# Patient Record
Sex: Male | Born: 1980 | Race: Black or African American | Hispanic: No | Marital: Married | Smoking: Former smoker
Health system: Southern US, Community
[De-identification: ages and names within clinical notes are randomized; demographics above are authoritative.]

## PROBLEM LIST (undated history)

## (undated) DIAGNOSIS — M549 Dorsalgia, unspecified: Secondary | ICD-10-CM

## (undated) DIAGNOSIS — C6290 Malignant neoplasm of unspecified testis, unspecified whether descended or undescended: Secondary | ICD-10-CM

## (undated) DIAGNOSIS — T7840XA Allergy, unspecified, initial encounter: Secondary | ICD-10-CM

## (undated) HISTORY — DX: Allergy, unspecified, initial encounter: T78.40XA

## (undated) HISTORY — PX: OTHER SURGICAL HISTORY: SHX169

## (undated) HISTORY — DX: Malignant neoplasm of unspecified testis, unspecified whether descended or undescended: C62.90

---
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
## 2002-07-12 ENCOUNTER — Emergency Department (HOSPITAL_COMMUNITY): Admission: EM | Admit: 2002-07-12 | Discharge: 2002-07-12 | Payer: Self-pay | Admitting: Emergency Medicine

## 2005-03-25 ENCOUNTER — Emergency Department (HOSPITAL_COMMUNITY): Admission: EM | Admit: 2005-03-25 | Discharge: 2005-03-25 | Payer: Self-pay | Admitting: Emergency Medicine

## 2005-11-16 ENCOUNTER — Ambulatory Visit: Payer: Self-pay | Admitting: Psychiatry

## 2005-11-16 ENCOUNTER — Inpatient Hospital Stay (HOSPITAL_COMMUNITY): Admission: RE | Admit: 2005-11-16 | Discharge: 2005-11-20 | Payer: Self-pay | Admitting: Psychiatry

## 2005-12-08 ENCOUNTER — Emergency Department (HOSPITAL_COMMUNITY): Admission: EM | Admit: 2005-12-08 | Discharge: 2005-12-08 | Payer: Self-pay | Admitting: Emergency Medicine

## 2006-07-09 ENCOUNTER — Emergency Department (HOSPITAL_COMMUNITY): Admission: EM | Admit: 2006-07-09 | Discharge: 2006-07-09 | Payer: Self-pay | Admitting: Emergency Medicine

## 2008-08-04 ENCOUNTER — Emergency Department (HOSPITAL_COMMUNITY): Admission: EM | Admit: 2008-08-04 | Discharge: 2008-08-04 | Payer: Self-pay | Admitting: Emergency Medicine

## 2009-10-01 ENCOUNTER — Emergency Department (HOSPITAL_COMMUNITY): Admission: EM | Admit: 2009-10-01 | Discharge: 2009-10-01 | Payer: Self-pay | Admitting: Family Medicine

## 2009-11-20 ENCOUNTER — Emergency Department (HOSPITAL_COMMUNITY): Admission: EM | Admit: 2009-11-20 | Discharge: 2009-11-20 | Payer: Self-pay | Admitting: Emergency Medicine

## 2010-02-03 ENCOUNTER — Emergency Department (HOSPITAL_COMMUNITY): Admission: EM | Admit: 2010-02-03 | Discharge: 2010-02-03 | Payer: Self-pay | Admitting: Emergency Medicine

## 2010-06-19 ENCOUNTER — Emergency Department (HOSPITAL_COMMUNITY): Admission: EM | Admit: 2010-06-19 | Discharge: 2010-06-20 | Payer: Self-pay | Admitting: Emergency Medicine

## 2010-10-14 ENCOUNTER — Emergency Department (HOSPITAL_COMMUNITY): Admission: EM | Admit: 2010-10-14 | Discharge: 2010-10-14 | Payer: Self-pay | Admitting: Emergency Medicine

## 2011-05-15 NOTE — Discharge Summary (Signed)
NAMEMALEKI, HIPPE               ACCOUNT NO.:  [id number]   MEDICAL RECORD NO.:  [id number]          PATIENT TYPE:  IPS   LOCATION:  0303                          FACILITY:  [facility]   PHYSICIAN:  Geoffery Lyons, M.D.      DATE OF BIRTH:  22-Aug-1981   DATE OF ADMISSION:  11/16/2005  DATE OF DISCHARGE:  11/20/2005                                 DISCHARGE SUMMARY   CHIEF COMPLAINT AND PRESENT ILLNESS:  This was the first admission to [facility] for this 30 year old single African-American male  voluntarily admitted.  He apparently experienced a change in behavior,  becoming depressed.  There were auditory hallucinations, God's voice,  feeling guilty over things he has said, believed in three worlds, recently  seen God and the devil.  Believed that brother could read his mind.  He  thinks he is Jesus.  Sleeping poorly.  Reports recently pressured to do  marijuana.  Used drugs on Friday.  The patient believes he may have AIDS.   PAST PSYCHIATRIC HISTORY:  First time at [facility].  No previous  treatment.   ALCOHOL/DRUG HISTORY:  Recently smoked marijuana.  Not clear of pattern of  use.  There is also some benzodiazepine use.   MEDICAL HISTORY:  Healthy.   MEDICATIONS:  None.   PHYSICAL EXAMINATION:  Performed and failed to show any acute findings.   LABORATORY DATA:  Liver enzymes with SGOT 19, SGPT 14, total bilirubin 2.3,  TSH 1.548.  CBC with white blood cells 6.6, hemoglobin 15.3.  Drug screening  positive for benzodiazepines and marijuana.   MENTAL STATUS EXAM:  Alert, soft-spoken male.  Good eye contact.  Speech  soft, normal rate.  Mood feeling guilty.  Affect smiling inappropriately.  Thought processes positive for auditory hallucinations with visual  hallucinations, delusions.  Cognition was well-preserved.   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  Marijuana abuse.  AXIS II:  No diagnosis.  AXIS III:  No diagnosis.  AXIS IV:  Moderate.  AXIS V:  GAF upon admission 25; highest GAF in the last year 70.   HOSPITAL COURSE:  He was admitted.  He was started in individual and group  psychotherapy.  He was given Zyprexa Zydis and he was also given some  Librium.  We continued to work with the Zyprexa that he tolerated well.  Upon admission, he stated that he wanted to die for everyone's sin but he  was not going to commit suicide.  He endorsed that people would think that  he was crazy but he said he was not crazy, he was just loving everybody.  Affect was very labile, laughing and being tearful at the same time.  Seemed  to be responding to internal stimuli.  Endorsed that he heard voices all the  time.  There was evidence of the hyperreligiosity, ideas of reference,  auditory hallucinations.  There was a family session.  The mother stated  that she believed that the psychotic break  came because of his marijuana  use.  One month prior to this admission, he apparently got in trouble with  the police while drunk.  He, himself, said that he was less confused.  For  the next couple of days, he continued to improve.  He was still religiously  preoccupied.  Wanting to be __________ about the church but, the way he was  producing the material, he was more reality based as his mother was a  Programmer, multimedia.  He started playing piano.  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  On  November 19, 2005, he was endorsing he was doing really well.  Endorsed  suicidal or homicidal ideation.  No hallucinations.  He overall seemed  better, more in touch with reality.  No evidence of the hyperreligiosity,  the delusional ideas that he had evidenced before.  He said he was committed  to abstinence.  So overall, he seemed to be better for what he was  discharged to outpatient follow-up.   DISCHARGE DIAGNOSES:  AXIS I:  Rule out  bipolar disorder with psychosis.  Rule out substance-induced psychosis.  Marijuana abuse.  AXIS II:  No diagnosis.  AXIS III:  No diagnosis.  AXIS IV:  Moderate.  AXIS V:  GAF upon discharge 50.   DISCHARGE MEDICATIONS:  Zyprexa Zydis 20 mg at night.   FOLLOW UP:  Dr. Lang Snow at [facility].      Geoffery Lyons, M.D.  Electronically Signed     IL/MEDQ  D:  11/25/2005  T:  11/26/2005  Job:  [id number]

## 2011-07-23 ENCOUNTER — Emergency Department (HOSPITAL_COMMUNITY)
Admission: EM | Admit: 2011-07-23 | Discharge: 2011-07-24 | Disposition: A | Discharge status: Home / Self Care | Payer: BC Managed Care – PPO | Attending: Emergency Medicine | Admitting: Emergency Medicine

## 2011-07-23 DIAGNOSIS — X500XXA Overexertion from strenuous movement or load, initial encounter: Secondary | ICD-10-CM | POA: Insufficient documentation

## 2011-07-23 DIAGNOSIS — S239XXA Sprain of unspecified parts of thorax, initial encounter: Secondary | ICD-10-CM | POA: Insufficient documentation

## 2011-07-23 DIAGNOSIS — M546 Pain in thoracic spine: Secondary | ICD-10-CM | POA: Insufficient documentation

## 2011-07-23 DIAGNOSIS — G8929 Other chronic pain: Secondary | ICD-10-CM | POA: Insufficient documentation

## 2011-09-18 ENCOUNTER — Other Ambulatory Visit: Payer: Self-pay | Admitting: Urology

## 2011-09-24 ENCOUNTER — Emergency Department (HOSPITAL_COMMUNITY)
Admission: EM | Admit: 2011-09-24 | Discharge: 2011-09-24 | Disposition: A | Discharge status: Home / Self Care | Payer: BC Managed Care – PPO | Attending: Emergency Medicine | Admitting: Emergency Medicine

## 2011-09-24 ENCOUNTER — Emergency Department (HOSPITAL_COMMUNITY): Payer: BC Managed Care – PPO

## 2011-09-24 DIAGNOSIS — N508 Other specified disorders of male genital organs: Secondary | ICD-10-CM | POA: Insufficient documentation

## 2011-09-24 DIAGNOSIS — G8929 Other chronic pain: Secondary | ICD-10-CM | POA: Insufficient documentation

## 2011-09-24 DIAGNOSIS — N509 Disorder of male genital organs, unspecified: Secondary | ICD-10-CM | POA: Insufficient documentation

## 2011-09-24 DIAGNOSIS — M549 Dorsalgia, unspecified: Secondary | ICD-10-CM | POA: Insufficient documentation

## 2011-09-24 LAB — DIFFERENTIAL
Basophils Relative: 1 % (ref 0–1)
Eosinophils Absolute: 0.1 10*3/uL (ref 0.0–0.7)
Lymphs Abs: 1.5 10*3/uL (ref 0.7–4.0)
Monocytes Relative: 8 % (ref 3–12)
Neutro Abs: 3.5 10*3/uL (ref 1.7–7.7)
Neutrophils Relative %: 63 % (ref 43–77)

## 2011-09-24 LAB — CBC
Hemoglobin: 15.6 g/dL (ref 13.0–17.0)
MCH: 30.2 pg (ref 26.0–34.0)
Platelets: 217 10*3/uL (ref 150–400)
RBC: 5.16 MIL/uL (ref 4.22–5.81)
WBC: 5.5 10*3/uL (ref 4.0–10.5)

## 2011-09-24 LAB — BASIC METABOLIC PANEL
CO2: 32 mEq/L (ref 19–32)
Chloride: 104 mEq/L (ref 96–112)
Glucose, Bld: 86 mg/dL (ref 70–99)
Potassium: 3.9 mEq/L (ref 3.5–5.1)
Sodium: 140 mEq/L (ref 135–145)

## 2011-09-24 LAB — URINALYSIS, ROUTINE W REFLEX MICROSCOPIC
Glucose, UA: NEGATIVE mg/dL
Hgb urine dipstick: NEGATIVE
Leukocytes, UA: NEGATIVE
Protein, ur: NEGATIVE mg/dL
Specific Gravity, Urine: 1.018 (ref 1.005–1.030)
pH: 8 (ref 5.0–8.0)

## 2011-09-26 ENCOUNTER — Ambulatory Visit (HOSPITAL_COMMUNITY)
Admission: RE | Admit: 2011-09-26 | Discharge: 2011-09-26 | Disposition: A | Discharge status: Home / Self Care | Payer: BC Managed Care – PPO | Source: Ambulatory Visit | Attending: Urology | Admitting: Urology

## 2011-09-26 DIAGNOSIS — C6290 Malignant neoplasm of unspecified testis, unspecified whether descended or undescended: Secondary | ICD-10-CM | POA: Insufficient documentation

## 2011-09-26 LAB — SURGICAL PCR SCREEN: Staphylococcus aureus: NEGATIVE

## 2011-09-26 LAB — CBC
HCT: 46.5 % (ref 39.0–52.0)
Hemoglobin: 15.9 g/dL (ref 13.0–17.0)
MCV: 91.5 fL (ref 78.0–100.0)
RBC: 5.08 MIL/uL (ref 4.22–5.81)
RDW: 13.5 % (ref 11.5–15.5)
WBC: 4.5 10*3/uL (ref 4.0–10.5)

## 2011-10-02 NOTE — Op Note (Signed)
William Farmer, William Farmer               ACCOUNT NO.:  [id number]  MEDICAL RECORD NO.:  [id number]  LOCATION:  DAYL                         FACILITY:  [facility]  PHYSICIAN:  Nial Hawe I. Patsi Sears, M.D.DATE OF BIRTH:  1981-09-29  DATE OF PROCEDURE: DATE OF DISCHARGE:                              OPERATIVE REPORT   PREOPERATIVE DIAGNOSIS:  Right solid testicular mass.  POSTOPERATIVE DIAGNOSIS:  Cancer of the right testicle.  OPERATIONS:  Right radical orchiectomy with frozen section and evaluation.  SURGEON:  Hargis Vandyne I. Patsi Sears, M.D.  ANESTHESIA:  General LMA.  PREPARATION:  After appropriate preanesthesia, the patient was brought to the operating room, placed upon the operating room in dorsal supine position.  The right inguinal area was prepped and draped.  The right arm had previously been marked for side identification.  REVIEW OF HISTORY:  This 30 year old recently married male at 12-day history of a right testicular pain, was seen at [facility] emergency room.  Ultrasound of the testicle showed a bilateral microlithiasis, and a 1.7 x 1.3 x 1.6 cm solid and cystic right intratesticular mass, which was highly suspicious for testicle cancer.  The patient was seen in the office yesterday, and had blood drawn for hCG and alpha fetoprotein. Examination indeed shows a firm right testicular mass.  The patient was counseled with regard to right orchiectomy.  He will have frozen section to be sure that other diagnosis because of his young age, and recent marriage.  PROCEDURE:  A 5 cm right inguinal incision was made.  Subcutaneous tissue dissected yielding the external oblique fascia.  This was incised sharply, and then blunt scissors were used to dissect underneath in order to avoid injury to the ilioinguinal nerve.  The fascia was opened down to the external ring, and a right-angle clamp was used underneath the cord to dissect the cord at the level of the pubic tubercle.   The testicle was then delivered by blunt dissection into the wound, and the Penrose drain was doubly wrapped around the cord, and clamped.  The patient had the crossclamp time evaluated, and during that time, had opened biopsy of the testicle, which showed necrosis.  Viable testicle was then sent in for frozen section, and frozen section analysis showed definite cancer, with necrosis.  Complete diagnosis is pending, but frozen section favors yolk sac tumor.  Therefore, the patient underwent radical orchiectomy, with the spermatic cord then dissected into two portions, and each doubly clamped.  The testicle was amputated, and each clamp was then ligated with 0 PDS suture.  A 0 PDS suture ligature was placed with a long suture attachment.  Irrigation was accomplished and the suture and cord were then replaced in the wound.  The external oblique fascia was closed with 3-0 PDS suture in running fashion.  The subcutaneous tissue was closed with 3-0 Monocryl, and the skin was closed with skin stapler.  20 cc of Marcaine 0.25 plain was injected in the spermatic cord to afford a cord block, and also into the skin edges of the wound. The patient tolerated the procedure well.  He was given IV Tylenol at the beginning of the procedure, will be given IV Toradol at the end  of the procedure.  He was awakened, taken to recovery room in good condition.     Silena Wyss I. Patsi Sears, M.D.     SIT/MEDQ  D:  09/26/2011  T:  09/26/2011  Job:  [id number]  Electronically Signed by Jethro Bolus M.D. on 10/02/2011 12:53:19 PM

## 2011-10-05 ENCOUNTER — Encounter: Payer: BC Managed Care – PPO | Admitting: Oncology

## 2011-10-14 ENCOUNTER — Encounter (HOSPITAL_BASED_OUTPATIENT_CLINIC_OR_DEPARTMENT_OTHER): Payer: BC Managed Care – PPO | Admitting: Oncology

## 2011-10-14 ENCOUNTER — Other Ambulatory Visit: Payer: Self-pay | Admitting: Oncology

## 2011-10-14 ENCOUNTER — Ambulatory Visit (HOSPITAL_COMMUNITY)
Admission: RE | Admit: 2011-10-14 | Discharge: 2011-10-14 | Disposition: A | Discharge status: Home / Self Care | Payer: BC Managed Care – PPO | Source: Ambulatory Visit | Attending: Oncology | Admitting: Oncology

## 2011-10-14 DIAGNOSIS — R51 Headache: Secondary | ICD-10-CM

## 2011-10-14 DIAGNOSIS — C6290 Malignant neoplasm of unspecified testis, unspecified whether descended or undescended: Secondary | ICD-10-CM

## 2011-10-14 LAB — CBC WITH DIFFERENTIAL/PLATELET
EOS%: 1.6 % (ref 0.0–7.0)
Eosinophils Absolute: 0 10*3/uL (ref 0.0–0.5)
HCT: 46.2 % (ref 38.4–49.9)
LYMPH%: 45.6 % (ref 14.0–49.0)
MCHC: 34.6 g/dL (ref 32.0–36.0)
MONO#: 0.2 10*3/uL (ref 0.1–0.9)
MONO%: 7.5 % (ref 0.0–14.0)
NEUT#: 1.1 10*3/uL — ABNORMAL LOW (ref 1.5–6.5)
Platelets: 231 10*3/uL (ref 140–400)
WBC: 2.5 10*3/uL — ABNORMAL LOW (ref 4.0–10.3)
lymph#: 1.2 10*3/uL (ref 0.9–3.3)

## 2011-10-17 LAB — AFP TUMOR MARKER: AFP-Tumor Marker: 8.3 ng/mL — ABNORMAL HIGH (ref 0.0–8.0)

## 2011-10-17 LAB — COMPREHENSIVE METABOLIC PANEL
Alkaline Phosphatase: 63 U/L (ref 39–117)
Creatinine, Ser: 1.33 mg/dL (ref 0.50–1.35)
Glucose, Bld: 86 mg/dL (ref 70–99)
Sodium: 137 mEq/L (ref 135–145)
Total Bilirubin: 1.8 mg/dL — ABNORMAL HIGH (ref 0.3–1.2)
Total Protein: 7.6 g/dL (ref 6.0–8.3)

## 2011-10-17 LAB — BETA HCG QUANT (REF LAB): Beta hCG, Tumor Marker: <0.5 m[IU]/mL (ref <5.0)

## 2011-12-26 ENCOUNTER — Telehealth: Payer: Self-pay | Admitting: Oncology

## 2011-12-26 NOTE — Telephone Encounter (Signed)
pt aware of appts for 2/19 and 2/22,per mosaiq    aom

## 2012-01-25 ENCOUNTER — Encounter: Payer: Self-pay | Admitting: *Deleted

## 2012-02-16 ENCOUNTER — Other Ambulatory Visit (HOSPITAL_BASED_OUTPATIENT_CLINIC_OR_DEPARTMENT_OTHER): Payer: BC Managed Care – PPO | Admitting: Lab

## 2012-02-16 DIAGNOSIS — C6290 Malignant neoplasm of unspecified testis, unspecified whether descended or undescended: Secondary | ICD-10-CM

## 2012-02-16 LAB — COMPREHENSIVE METABOLIC PANEL
ALT: 19 U/L (ref 0–53)
Albumin: 4.4 g/dL (ref 3.5–5.2)
CO2: 29 mEq/L (ref 19–32)
Glucose, Bld: 82 mg/dL (ref 70–99)
Potassium: 3.9 mEq/L (ref 3.5–5.3)
Sodium: 140 mEq/L (ref 135–145)
Total Protein: 7.5 g/dL (ref 6.0–8.3)

## 2012-02-16 LAB — CBC WITH DIFFERENTIAL/PLATELET
Basophils Absolute: 0 10*3/uL (ref 0.0–0.1)
Eosinophils Absolute: 0.1 10*3/uL (ref 0.0–0.5)
HGB: 14.5 g/dL (ref 13.0–17.1)
MCV: 88.1 fL (ref 79.3–98.0)
MONO#: 0.3 10*3/uL (ref 0.1–0.9)
NEUT#: 1.3 10*3/uL — ABNORMAL LOW (ref 1.5–6.5)
RBC: 4.77 10*6/uL (ref 4.20–5.82)
RDW: 13.3 % (ref 11.0–14.6)
WBC: 2.6 10*3/uL — ABNORMAL LOW (ref 4.0–10.3)
lymph#: 1.1 10*3/uL (ref 0.9–3.3)
nRBC: 0 % (ref 0–0)

## 2012-02-16 LAB — LACTATE DEHYDROGENASE: LDH: 198 U/L (ref 94–250)

## 2012-02-19 ENCOUNTER — Ambulatory Visit (HOSPITAL_BASED_OUTPATIENT_CLINIC_OR_DEPARTMENT_OTHER): Payer: BC Managed Care – PPO | Admitting: Oncology

## 2012-02-19 VITALS — BP 126/85 | HR 59 | Temp 97.0°F | Ht 70.5 in | Wt 147.7 lb

## 2012-02-19 DIAGNOSIS — C6290 Malignant neoplasm of unspecified testis, unspecified whether descended or undescended: Secondary | ICD-10-CM

## 2012-02-19 LAB — AFP TUMOR MARKER: AFP-Tumor Marker: 4.3 ng/mL (ref 0.0–8.0)

## 2012-02-19 NOTE — Progress Notes (Signed)
Hematology and Oncology Follow Up Visit  MUAD NOGA [id number] 23-Apr-1981 31 y.o. 02/19/2012 11:43 AM    Principle Diagnosis: 31 year old gentleman with recent diagnosis of testicular cancer.  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   Interim History:  Mr. Willette presents today for a follow up visit. he is asymptomatic at this point.  He is reporting some tenderness around the right groin area.  He does report some occasional headaches really not associated with any neurological symptoms.  He is not waking him up at night.  It is not associated with any confusion or disorientation. No new complaints at this time.   Medications: I have reviewed the patient's current medications. Current outpatient prescriptions:acetaminophen (TYLENOL) 650 MG CR tablet, Take 650 mg by mouth every 8 (eight) hours as needed., Disp: , Rfl:   Allergies:  Allergies  Allergen Reactions  . Alprazolam   . Codeine   . Iodine     Past Medical History, Surgical history, Social history, and Family History were reviewed and updated.  Review of Systems: Constitutional:  Negative for fever, chills, night sweats, anorexia, weight loss, pain. Cardiovascular: no chest pain or dyspnea on exertion Respiratory: no cough, shortness of breath, or wheezing Neurological: no TIA or stroke symptoms Dermatological: negative ENT: negative Skin: Negative. Gastrointestinal: no abdominal pain, change in bowel habits, or black or bloody stools Genito-Urinary: no dysuria, trouble voiding, or  hematuria Hematological and Lymphatic: negative Breast: negative Musculoskeletal: negative Remaining ROS negative. Physical Exam: Blood pressure 126/85, pulse 59, temperature 97 F (36.1 C), temperature source Oral, height 5' 10.5" (1.791 m), weight 147 lb 11.2 oz (66.996 kg). ECOG: 0 General appearance: alert Head: Normocephalic, without obvious abnormality, atraumatic Neck: no adenopathy, no carotid bruit, no JVD, supple, symmetrical, trachea midline and thyroid not enlarged, symmetric, no tenderness/mass/nodules Lymph nodes: Cervical, supraclavicular, and axillary nodes normal. Heart:regular rate and rhythm, S1, S2 normal, no murmur, click, rub or gallop Lung:chest clear, no wheezing, rales, normal symmetric air entry. Abdomin: soft, non-tender, without masses or organomegaly EXT:no erythema, induration, or nodules   Lab Results: Lab Results  Component Value Date   WBC 2.6* 02/16/2012   HGB 14.5 02/16/2012   HCT 42.0 02/16/2012   MCV 88.1 02/16/2012   PLT 211 02/16/2012     Chemistry      Component Value Date/Time   NA 140 02/16/2012 1606   K 3.9 02/16/2012 1606   CL 103 02/16/2012 1606   CO2 29 02/16/2012 1606   BUN 11 02/16/2012 1606   CREATININE 1.14 02/16/2012 1606      Component Value Date/Time   CALCIUM 9.7 02/16/2012 1606   ALKPHOS 75 02/16/2012 1606   AST 18 02/16/2012 1606   ALT 19 02/16/2012 1606   BILITOT 0.7 02/16/2012 1606     Results for Asaro, Izaac A (MRN [id number]) as of 02/19/2012 09:45  Ref. Range 02/16/2012 16:06  AFP-Tumor Marker Latest Range: 0.0-8.0 ng/mL 4.3   Results for CORNELL, BOURBON (MRN [id number]) as of 02/19/2012 09:45  Ref. Range 02/16/2012 16:06  LD Latest  Range: 94-250 U/L 198   Impression and Plan:  This is a pleasant 31 year old gentleman with recent diagnosis of testicular cancer.  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  As mentioned, his CT scan of the abdomen and pelvis was negative. He is currently on active  surveillance.  No evidence of disease relapse at this time.  The plan is to continue follow up with CT scan every 4 months and clinical visit every 2-3 months as well as tumor markers.  [facility], MD 2/22/201311:43 AM

## 2012-04-06 ENCOUNTER — Encounter (HOSPITAL_COMMUNITY): Payer: Self-pay | Admitting: *Deleted

## 2012-04-06 ENCOUNTER — Emergency Department (HOSPITAL_COMMUNITY)
Admission: EM | Admit: 2012-04-06 | Discharge: 2012-04-06 | Discharge status: Against Medical Advice | Payer: BC Managed Care – PPO | Attending: Emergency Medicine | Admitting: Emergency Medicine

## 2012-04-06 DIAGNOSIS — Z9889 Other specified postprocedural states: Secondary | ICD-10-CM | POA: Insufficient documentation

## 2012-04-06 DIAGNOSIS — K0889 Other specified disorders of teeth and supporting structures: Secondary | ICD-10-CM

## 2012-04-06 DIAGNOSIS — K089 Disorder of teeth and supporting structures, unspecified: Secondary | ICD-10-CM | POA: Insufficient documentation

## 2012-04-06 NOTE — ED Provider Notes (Signed)
Pt left AMA without informing staff. before I was able to see him or examine him. Could not find him anywhere.  Lottie Mussel, PA 04/06/12 [phone]

## 2012-04-06 NOTE — ED Notes (Signed)
Reports having wisdom teeth removed on Friday, having severe pain to bottom right side. No acute distress noted at triage.

## 2012-04-08 NOTE — ED Provider Notes (Signed)
Medical screening examination/treatment/procedure(s) were performed by non-physician practitioner and as supervising physician I was immediately available for consultation/collaboration.  Lynkin Saini T Findlay Dagher, MD 04/08/12 1548 

## 2012-05-18 ENCOUNTER — Inpatient Hospital Stay (HOSPITAL_COMMUNITY): Admission: RE | Admit: 2012-05-18 | Payer: BC Managed Care – PPO | Source: Ambulatory Visit

## 2012-05-19 ENCOUNTER — Ambulatory Visit (HOSPITAL_COMMUNITY)
Admission: RE | Admit: 2012-05-19 | Discharge: 2012-05-19 | Disposition: A | Discharge status: Home / Self Care | Payer: BC Managed Care – PPO | Source: Ambulatory Visit | Attending: Oncology | Admitting: Oncology

## 2012-05-19 DIAGNOSIS — C6290 Malignant neoplasm of unspecified testis, unspecified whether descended or undescended: Secondary | ICD-10-CM

## 2012-05-24 ENCOUNTER — Other Ambulatory Visit (HOSPITAL_COMMUNITY): Payer: BC Managed Care – PPO

## 2012-05-25 ENCOUNTER — Other Ambulatory Visit: Payer: BC Managed Care – PPO | Admitting: Lab

## 2012-05-25 ENCOUNTER — Ambulatory Visit: Payer: BC Managed Care – PPO | Admitting: Oncology

## 2012-06-13 ENCOUNTER — Other Ambulatory Visit: Payer: Self-pay | Admitting: Oncology

## 2012-06-13 DIAGNOSIS — C6290 Malignant neoplasm of unspecified testis, unspecified whether descended or undescended: Secondary | ICD-10-CM

## 2012-06-14 ENCOUNTER — Telehealth: Payer: Self-pay | Admitting: Oncology

## 2012-06-14 NOTE — Telephone Encounter (Signed)
Pt stopped by to r/s may appts    aom

## 2012-06-21 ENCOUNTER — Encounter (HOSPITAL_COMMUNITY): Payer: Self-pay | Admitting: *Deleted

## 2012-06-21 ENCOUNTER — Emergency Department (HOSPITAL_COMMUNITY)
Admission: EM | Admit: 2012-06-21 | Discharge: 2012-06-21 | Disposition: A | Discharge status: Home / Self Care | Payer: BC Managed Care – PPO | Attending: Emergency Medicine | Admitting: Emergency Medicine

## 2012-06-21 DIAGNOSIS — Z87891 Personal history of nicotine dependence: Secondary | ICD-10-CM | POA: Insufficient documentation

## 2012-06-21 DIAGNOSIS — H9209 Otalgia, unspecified ear: Secondary | ICD-10-CM | POA: Insufficient documentation

## 2012-06-21 MED ORDER — NEOMYCIN-POLYMYXIN-HC 3.5-10000-1 OT SUSP: 4.0000 [drp] | Freq: Three times a day (TID) | OTIC | Status: AC

## 2012-06-21 NOTE — ED Provider Notes (Signed)
History   This chart was scribed for Nelia Shi, MD by Sofie Rower. The patient was seen in room TR11C/TR11C and the patient's care was started at 2:08 PM     CSN: [id number]  Arrival date & time 06/21/12  1223   None     Chief Complaint  Patient presents with  . Otalgia    (Consider location/radiation/quality/duration/timing/severity/associated sxs/prior treatment) Patient is a 31 y.o. male presenting with ear pain. The history is provided by the patient. No language interpreter was used.  Otalgia This is a new problem. The current episode started more than 1 week ago. There is pain in the right ear. The problem occurs constantly. The problem has not changed since onset.There has been no fever. The pain is mild. Pertinent negatives include no ear discharge and no headaches. His past medical history does not include hearing loss.    William Farmer is a 31 y.o. male who presents to the Emergency Department complaining of moderate, episodic ear pain located in the right ear onset two weeks ago with associated symptoms of loss of balance. The pt describes the pain as clapping, loud, his equilibrium would be off. The pt informs the EDP that he was off balance today. The pt went swimming and played the keyboard yesterday. Modifying factors include taking amoxicillin (4 pills over two days) which provides moderate relief. Pt has a hx of back pain, cancer.   Pt denies diabetes, heart disease, listening to music with headphones, ringing in his hears.  Pt works third shift.     Past Medical History  Diagnosis Date  . Malignant neoplasm of other and unspecified testis       History  Substance Use Topics  . Smoking status: Former Games developer  . Smokeless tobacco: Not on file  . Alcohol Use: No      Review of Systems  HENT: Positive for ear pain. Negative for ear discharge.   Neurological: Negative for headaches.  All other systems reviewed and are negative.    Allergies    Alprazolam; Bee venom; Codeine; and Iodine  Home Medications   Current Outpatient Rx  Name Route Sig Dispense Refill  . AMOXICILLIN 500 MG PO CAPS Oral Take 500 mg by mouth 3 (three) times daily.    . MULTI-VITAMIN/MINERALS PO TABS Oral Take 1 tablet by mouth daily.    Marland Kitchen DEXAMETHASONE 4 MG PO TABS      . HYDROCODONE-ACETAMINOPHEN 10-325 MG PO TABS      . IBUPROFEN 600 MG PO TABS      . OXYCODONE-ACETAMINOPHEN 5-325 MG PO TABS      . PREDNISONE 50 MG PO TABS        BP 125/62  Pulse 58  Temp 98.1 F (36.7 C) (Oral)  Resp 16  SpO2 98%  Physical Exam  Nursing note and vitals reviewed. Constitutional: He is oriented to person, place, and time. He appears well-developed and well-nourished. No distress.  HENT:  Head: Normocephalic and atraumatic.  Right Ear: There is swelling and tenderness. No mastoid tenderness. Tympanic membrane is not retracted and not bulging. Decreased hearing is noted.  Eyes: Pupils are equal, round, and reactive to light.  Neck: Normal range of motion.  Cardiovascular: Normal rate and intact distal pulses.   Pulmonary/Chest: No respiratory distress.  Abdominal: Normal appearance. He exhibits no distension.  Musculoskeletal: Normal range of motion.  Neurological: He is alert and oriented to person, place, and time. No cranial nerve deficit.  Skin: Skin is  warm and dry. No rash noted.  Psychiatric: He has a normal mood and affect. His behavior is normal.    ED Course  Procedures (including critical care time)  DIAGNOSTIC STUDIES: Oxygen Saturation is 98% on room air, normal by my interpretation.    COORDINATION OF CARE:     Labs Reviewed - No data to display No results found.   1. Otalgia       MDM         I personally performed the services described in this documentation, which was scribed in my presence. The recorded information has been reviewed and considered.    Nelia Shi, MD 07/10/12 [phone]

## 2012-06-21 NOTE — ED Notes (Signed)
Patient with right ear pain x 2 weeks, patient also states he feels like equilibrium is off sometimes

## 2012-06-24 ENCOUNTER — Other Ambulatory Visit (HOSPITAL_BASED_OUTPATIENT_CLINIC_OR_DEPARTMENT_OTHER): Payer: BC Managed Care – PPO | Admitting: Lab

## 2012-06-24 ENCOUNTER — Ambulatory Visit (HOSPITAL_COMMUNITY)
Admission: RE | Admit: 2012-06-24 | Discharge: 2012-06-24 | Disposition: A | Discharge status: Home / Self Care | Payer: BC Managed Care – PPO | Source: Ambulatory Visit | Attending: Oncology | Admitting: Oncology

## 2012-06-24 DIAGNOSIS — C6290 Malignant neoplasm of unspecified testis, unspecified whether descended or undescended: Secondary | ICD-10-CM | POA: Insufficient documentation

## 2012-06-24 DIAGNOSIS — Z9079 Acquired absence of other genital organ(s): Secondary | ICD-10-CM | POA: Insufficient documentation

## 2012-06-24 LAB — CBC WITH DIFFERENTIAL/PLATELET
BASO%: 0.3 % (ref 0.0–2.0)
EOS%: 0.1 % (ref 0.0–7.0)
MCH: 31.4 pg (ref 27.2–33.4)
MCHC: 33.9 g/dL (ref 32.0–36.0)
MONO%: 0.6 % (ref 0.0–14.0)
RBC: 4.92 10*6/uL (ref 4.20–5.82)
RDW: 14 % (ref 11.0–14.6)
lymph#: 0.4 10*3/uL — ABNORMAL LOW (ref 0.9–3.3)

## 2012-06-24 LAB — CMP (CANCER CENTER ONLY)
ALT(SGPT): 31 U/L (ref 10–47)
AST: 27 U/L (ref 11–38)
Calcium: 9.4 mg/dL (ref 8.0–10.3)
Chloride: 96 mEq/L — ABNORMAL LOW (ref 98–108)
Creat: 1.2 mg/dl (ref 0.6–1.2)

## 2012-06-24 MED ORDER — IOHEXOL 300 MG/ML  SOLN
100.0000 mL | Freq: Once | INTRAMUSCULAR | Status: AC | PRN
  Administered 2012-06-24: 100 mL via INTRAVENOUS

## 2012-06-27 LAB — AFP TUMOR MARKER: AFP-Tumor Marker: 5.5 ng/mL (ref 0.0–8.0)

## 2012-06-27 LAB — BETA HCG QUANT (REF LAB): Beta hCG, Tumor Marker: <0.5 m[IU]/mL (ref <5.0)

## 2012-06-28 ENCOUNTER — Ambulatory Visit (HOSPITAL_BASED_OUTPATIENT_CLINIC_OR_DEPARTMENT_OTHER): Payer: BC Managed Care – PPO | Admitting: Oncology

## 2012-06-28 ENCOUNTER — Ambulatory Visit (INDEPENDENT_AMBULATORY_CARE_PROVIDER_SITE_OTHER): Payer: BC Managed Care – PPO | Admitting: Internal Medicine

## 2012-06-28 ENCOUNTER — Telehealth: Payer: Self-pay | Admitting: Oncology

## 2012-06-28 ENCOUNTER — Ambulatory Visit: Payer: BC Managed Care – PPO

## 2012-06-28 VITALS — BP 122/75 | HR 51 | Temp 96.8°F | Ht 71.1 in | Wt 146.3 lb

## 2012-06-28 VITALS — BP 120/64 | HR 57 | Temp 98.3°F | Resp 16 | Ht 71.18 in | Wt 144.8 lb

## 2012-06-28 DIAGNOSIS — C6290 Malignant neoplasm of unspecified testis, unspecified whether descended or undescended: Secondary | ICD-10-CM | POA: Insufficient documentation

## 2012-06-28 DIAGNOSIS — M25579 Pain in unspecified ankle and joints of unspecified foot: Secondary | ICD-10-CM

## 2012-06-28 DIAGNOSIS — R42 Dizziness and giddiness: Secondary | ICD-10-CM

## 2012-06-28 DIAGNOSIS — M549 Dorsalgia, unspecified: Secondary | ICD-10-CM

## 2012-06-28 DIAGNOSIS — H9190 Unspecified hearing loss, unspecified ear: Secondary | ICD-10-CM

## 2012-06-28 NOTE — Progress Notes (Signed)
Hematology and Oncology Follow Up Visit  William Farmer [id number] Dec 15, 1981 31 y.o. 06/28/2012 3:36 PM    Principle Diagnosis: 31 year old gentleman with recent diagnosis of testicular cancer.  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   Interim History:  Mr. Guillotte presents today for Farmer follow up visit. he is asymptomatic at this point.  He is reporting less tenderness around the right groin area.  He does report some occasional headaches really not associated with any neurological symptoms. He is reporting ear pain but he swims Farmer lot at that time.  It is not associated with any confusion or disorientation. No new complaints at this time.   Medications: I have reviewed the patient's current medications. Current outpatient prescriptions:amoxicillin (AMOXIL) 500 MG capsule, Take 500 mg by mouth 3 (three) times daily., Disp: , Rfl: ;  dexamethasone (DECADRON) 4 MG tablet, , Disp: , Rfl: ;  HYDROcodone-acetaminophen (NORCO) 10-325 MG per tablet, , Disp: , Rfl: ;  ibuprofen (ADVIL,MOTRIN) 600 MG tablet, , Disp: , Rfl: ;  Multiple Vitamins-Minerals (MULTIVITAMIN WITH MINERALS) tablet, Take 1 tablet by mouth daily., Disp: , Rfl:  neomycin-polymyxin-hydrocortisone (CORTISPORIN) 3.5-10000-1 otic suspension, Place 4 drops in ear(s) 3 (three) times daily., Disp: 10 mL, Rfl: 0;  oxyCODONE-acetaminophen (PERCOCET) 5-325 MG per tablet, , Disp: , Rfl: ;  predniSONE (DELTASONE) 50 MG tablet, , Disp: , Rfl:   Allergies:  Allergies  Allergen Reactions  . Alprazolam     unknown  . Bee Venom Swelling   . Codeine Itching    Hives, twitching  . Iodine     unknown    Past Medical History, Surgical history, Social history, and Family History were reviewed and updated.  Review of Systems: Constitutional:  Negative for fever, chills, night sweats, anorexia, weight loss, pain. Cardiovascular: no chest pain or dyspnea on exertion Respiratory: no cough, shortness of breath, or wheezing Neurological: no TIA or stroke symptoms Dermatological: negative ENT: negative Skin: Negative. Gastrointestinal: no abdominal pain, change in bowel habits, or black or bloody stools Genito-Urinary: no dysuria, trouble voiding, or hematuria Hematological and Lymphatic: negative Breast: negative Musculoskeletal: negative Remaining ROS negative. Physical Exam: Blood pressure 122/75, pulse 51, temperature 96.8 F (36 C), temperature source Oral, height 5' 11.1" (1.806 m), weight 146 lb 4.8 oz (66.361 kg). ECOG: 0 General appearance: alert Head: Normocephalic, without obvious abnormality, atraumatic Neck: no adenopathy, no carotid bruit, no JVD, supple, symmetrical, trachea midline and thyroid not enlarged, symmetric, no tenderness/mass/nodules Lymph nodes: Cervical, supraclavicular, and axillary nodes normal. Heart:regular rate and rhythm, S1, S2 normal, no murmur, click, rub or gallop Lung:chest clear, no wheezing, rales, normal symmetric air entry. Abdomin: soft, non-tender, without masses or organomegaly EXT:no erythema, induration, or nodules   Lab Results: Lab Results  Component Value Date   WBC 4.6 06/24/2012   HGB 15.5 06/24/2012   HCT 45.6 06/24/2012   MCV 92.7 06/24/2012   PLT 224 06/24/2012     Chemistry      Component Value Date/Time   NA 137 06/24/2012 1131   NA 140 02/16/2012  1606   K 4.8* 06/24/2012 1131   K 3.9 02/16/2012 1606   CL 96* 06/24/2012 1131   CL 103 02/16/2012 1606   CO2 31 06/24/2012 1131   CO2 29 02/16/2012 1606   BUN 15 06/24/2012 1131   BUN 11 02/16/2012 1606   CREATININE  1.2 06/24/2012 1131   CREATININE 1.14 02/16/2012 1606      Component Value Date/Time   CALCIUM 9.4 06/24/2012 1131   CALCIUM 9.7 02/16/2012 1606   ALKPHOS 73 06/24/2012 1131   ALKPHOS 75 02/16/2012 1606   AST 27 06/24/2012 1131   AST 18 02/16/2012 1606   ALT 19 02/16/2012 1606   BILITOT 1.90* 06/24/2012 1131   BILITOT 0.7 02/16/2012 1606     Results for William, Tarrell Farmer (MRN [id number]) as of 06/28/2012 15:38  Ref. Range 06/24/2012 11:31  AFP-Tumor Marker Latest Range: 0.0-8.0 ng/mL 5.5  Beta hCG, Tumor Marker Latest Range: < 5.0 mIU/mL < 0.5    Impression and Plan:  This is Farmer pleasant 31 year old gentleman with recent diagnosis of testicular cancer.He is currently on active surveillance  His final staging is stage IA disease as he had Farmer T1 N0 disease.   His CT scan of the abdomen and pelvis was negative and was discussed today  No evidence of disease relapse at this time.  The plan is to continue follow up with CT scan every 4 months and clinical visit at that time.  Eli Hose, MD 7/2/20133:36 PM

## 2012-06-28 NOTE — Telephone Encounter (Signed)
appts made and printed for pt aom °

## 2012-06-28 NOTE — Progress Notes (Signed)
  Subjective:    Patient ID: William Farmer, male    DOB: January 31, 1981, 31 y.o.   MRN: [id number]  HPI Chief Complaint  Patient presents with  . Ear Fullness    right ear.  feels off balance, few weeks. No illness or allergies. 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 Ankle Injury    x 1 month    left / edema/basketball/easy to reinjure  . Back Pain    x months? Off/on-aches in am,at work,after sports  . Knee Pain    x  2 weeks  left knee -off and on for months/comes back after running /no pain with activity --both knees at times       Review of Systemshx testic CA s/p rx and doing well /see CTs clear yesterday No fever night sweats or weight loss     Objective:   Physical ExamVital signs stable Right TM has a opaque fibrous appearance The canal is clear Audiogram shows mild decrease in hearing across all decibels compared to left ear Cranial nerves II through XII are intact/Romberg negative/gait normal  Left ankle with tenderness over the distal fibula that is worse with range of motion No laxity/Achilles normal/nonswollen  Both knees with slight tenderness in the infrapatellar tendon area  Tender bilaterally over the lumbar muscles adjacent to the spine above the iliac crests Straight leg raise negative bilaterally/hips with good range of motion     [facility] reading (PRIMARY) by  Dr. Josephina Gip fx   Assessment & Plan:   1. Ankle pain  DG Ankle Complete Left Normal/Swede-O brace at work, with running, with basketball for 6 months/ankle rehabilitation exercises  2. Back pain  Back rehabilitation exercises for 8 weeks/stay as active as possible  3. Dizzy    4. Hearing loss    5. Testicular cancer  Posttreatment is stable   With his abnormal TM, dizziness, and hearing loss and I think he  needs an ENT evaluation to rule out a middle ear lesion

## 2012-08-04 ENCOUNTER — Encounter: Payer: Self-pay | Admitting: Internal Medicine

## 2012-08-04 ENCOUNTER — Encounter (HOSPITAL_COMMUNITY): Payer: Self-pay | Admitting: Emergency Medicine

## 2012-08-04 ENCOUNTER — Emergency Department (HOSPITAL_COMMUNITY)
Admission: EM | Admit: 2012-08-04 | Discharge: 2012-08-04 | Disposition: A | Discharge status: Home / Self Care | Payer: BC Managed Care – PPO | Attending: Emergency Medicine | Admitting: Emergency Medicine

## 2012-08-04 DIAGNOSIS — T63481A Toxic effect of venom of other arthropod, accidental (unintentional), initial encounter: Secondary | ICD-10-CM | POA: Insufficient documentation

## 2012-08-04 DIAGNOSIS — T6391XA Toxic effect of contact with unspecified venomous animal, accidental (unintentional), initial encounter: Secondary | ICD-10-CM | POA: Insufficient documentation

## 2012-08-04 NOTE — ED Notes (Signed)
Pt called for room no answer; EMT saw pt and visitor walk outside; called outside with no answer

## 2012-08-04 NOTE — ED Notes (Addendum)
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  Patient reports pain at the site; denies shortness of breath, chest pain, and any other allergic reaction signs/symptoms.  Patient took 1 tablet of Benadryl before coming to the ED.  No redness or swelling noted around site of sting.  Airway clear; no respiratory or acute distress noted.

## 2012-10-14 ENCOUNTER — Ambulatory Visit (INDEPENDENT_AMBULATORY_CARE_PROVIDER_SITE_OTHER): Payer: BC Managed Care – PPO | Admitting: Family Medicine

## 2012-10-14 DIAGNOSIS — Z23 Encounter for immunization: Secondary | ICD-10-CM

## 2012-10-31 ENCOUNTER — Ambulatory Visit (HOSPITAL_COMMUNITY): Payer: BC Managed Care – PPO

## 2012-10-31 ENCOUNTER — Other Ambulatory Visit: Payer: BC Managed Care – PPO | Admitting: Lab

## 2012-11-07 ENCOUNTER — Ambulatory Visit (HOSPITAL_COMMUNITY)
Admission: RE | Admit: 2012-11-07 | Discharge: 2012-11-07 | Disposition: A | Discharge status: Home / Self Care | Payer: BC Managed Care – PPO | Source: Ambulatory Visit | Attending: Oncology | Admitting: Oncology

## 2012-11-07 DIAGNOSIS — Z1289 Encounter for screening for malignant neoplasm of other sites: Secondary | ICD-10-CM | POA: Insufficient documentation

## 2012-11-07 DIAGNOSIS — Z9079 Acquired absence of other genital organ(s): Secondary | ICD-10-CM | POA: Insufficient documentation

## 2012-11-07 DIAGNOSIS — C6290 Malignant neoplasm of unspecified testis, unspecified whether descended or undescended: Secondary | ICD-10-CM | POA: Insufficient documentation

## 2012-11-07 MED ORDER — IOHEXOL 300 MG/ML  SOLN
100.0000 mL | Freq: Once | INTRAMUSCULAR | Status: AC | PRN
  Administered 2012-11-07: 100 mL via INTRAVENOUS

## 2012-11-08 ENCOUNTER — Ambulatory Visit: Payer: BC Managed Care – PPO | Admitting: Oncology

## 2013-02-14 ENCOUNTER — Telehealth: Payer: Self-pay | Admitting: Oncology

## 2013-02-14 NOTE — Telephone Encounter (Signed)
returned pt call with d/t of est with PA per Dr. Clelia Croft

## 2013-02-20 ENCOUNTER — Ambulatory Visit (HOSPITAL_BASED_OUTPATIENT_CLINIC_OR_DEPARTMENT_OTHER): Payer: BC Managed Care – PPO | Admitting: Oncology

## 2013-02-20 ENCOUNTER — Encounter: Payer: Self-pay | Admitting: Oncology

## 2013-02-20 ENCOUNTER — Ambulatory Visit (HOSPITAL_BASED_OUTPATIENT_CLINIC_OR_DEPARTMENT_OTHER): Payer: BC Managed Care – PPO

## 2013-02-20 ENCOUNTER — Telehealth: Payer: Self-pay | Admitting: Oncology

## 2013-02-20 VITALS — BP 127/73 | HR 56 | Temp 97.7°F | Resp 16 | Wt 151.6 lb

## 2013-02-20 DIAGNOSIS — R5383 Other fatigue: Secondary | ICD-10-CM

## 2013-02-20 DIAGNOSIS — C6290 Malignant neoplasm of unspecified testis, unspecified whether descended or undescended: Secondary | ICD-10-CM

## 2013-02-20 DIAGNOSIS — R5381 Other malaise: Secondary | ICD-10-CM

## 2013-02-20 LAB — CBC WITH DIFFERENTIAL/PLATELET
BASO%: 0.8 % (ref 0.0–2.0)
EOS%: 1.2 % (ref 0.0–7.0)
HCT: 44.3 % (ref 38.4–49.9)
LYMPH%: 36.3 % (ref 14.0–49.0)
MCH: 30.4 pg (ref 27.2–33.4)
MCHC: 34.1 g/dL (ref 32.0–36.0)
MCV: 89.3 fL (ref 79.3–98.0)
MONO%: 8.6 % (ref 0.0–14.0)
NEUT%: 53.1 % (ref 39.0–75.0)
Platelets: 217 10*3/uL (ref 140–400)

## 2013-02-20 LAB — COMPREHENSIVE METABOLIC PANEL (CC13)
ALT: 17 U/L (ref 0–55)
AST: 15 U/L (ref 5–34)
Creatinine: 1.4 mg/dL — ABNORMAL HIGH (ref 0.7–1.3)
Total Bilirubin: 1.21 mg/dL — ABNORMAL HIGH (ref 0.20–1.20)

## 2013-02-20 NOTE — Progress Notes (Signed)
Hematology and Oncology Follow Up Visit  William Farmer [id number] 1981-11-11 31 y.o. 02/20/2013 4:48 PM    Principle Diagnosis: 32 year old gentleman with recent diagnosis of testicular cancer.  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   Interim History:  William Farmer presents today for Farmer follow up visit. He missed Farmer visit with [place] in November 2013 as his first daughter was born around that time. He is reporting less tenderness around the right groin area.  States he has had some pain to his left testicle in the past few weeks; pain comes and goes. He cannot palpate any discrete mass. He has more fatigue, but thinks this is due to the new baby keeping him up frequently. No chest pain, cough, shortness of breath. No abdominal pain, nausea, or vomiting.  Medications: I have reviewed the patient's current medications. Current outpatient prescriptions:ibuprofen (ADVIL,MOTRIN) 600 MG tablet, , Disp: , Rfl:   Allergies:  Allergies  Allergen Reactions  . Alprazolam     unknown  . Bee Venom Swelling  . Codeine Itching    Hives, twitching  . Iodine     unknown    Past Medical History, Surgical history, Social history, and Family History were reviewed and updated.  Review of Systems: Constitutional:  Negative for fever, chills, night sweats, anorexia, weight loss, pain. Cardiovascular: no chest pain or dyspnea on exertion Respiratory: no cough, shortness of breath, or wheezing Neurological: no TIA or stroke symptoms Dermatological: negative ENT: negative Skin:  Negative. Gastrointestinal: no abdominal pain, change in bowel habits, or black or bloody stools Genito-Urinary: no dysuria, trouble voiding, or hematuria Hematological and Lymphatic: negative Breast: negative Musculoskeletal: negative Remaining ROS negative.  Physical Exam: Blood pressure 127/73, pulse 56, temperature 97.7 F (36.5 C), temperature source Oral, resp. rate 16, weight 151 lb 9 oz (68.748 kg). ECOG: 0 General appearance: alert Head: Normocephalic, without obvious abnormality, atraumatic Neck: no adenopathy, no carotid bruit, no JVD, supple, symmetrical, trachea midline and thyroid not enlarged, symmetric, no tenderness/mass/nodules Lymph nodes: Cervical, supraclavicular, and axillary nodes normal. Heart:regular rate and rhythm, S1, S2 normal, no murmur, click, rub or gallop Lung:chest clear, no wheezing, rales, normal symmetric air entry. Abdomen: soft, non-tender, without masses or organomegaly EXT:no erythema, induration, or nodules   Lab Results: Lab Results  Component Value Date   WBC 2.5* 02/20/2013   HGB 15.1 02/20/2013   HCT 44.3 02/20/2013   MCV 89.3 02/20/2013   PLT 217 02/20/2013     Chemistry      Component Value Date/Time   NA 138 02/20/2013 1311   NA 137 06/24/2012 1131   NA 140 02/16/2012 1606   K 4.3 02/20/2013 1311   K 4.8* 06/24/2012 1131   K 3.9 02/16/2012 1606   CL 103 02/20/2013 1311   CL 96* 06/24/2012 1131   CL 103 02/16/2012 1606   CO2 28 02/20/2013 1311   CO2 31 06/24/2012 1131   CO2 29 02/16/2012 1606   BUN 15.9 02/20/2013 1311   BUN 15 06/24/2012 1131   BUN 11 02/16/2012 1606  CREATININE 1.4* 02/20/2013 1311   CREATININE 1.2 06/24/2012 1131   CREATININE 1.14 02/16/2012 1606      Component Value Date/Time   CALCIUM 9.3 02/20/2013 1311   CALCIUM 9.4 06/24/2012 1131   CALCIUM 9.7 02/16/2012 1606   ALKPHOS 83 02/20/2013 1311   ALKPHOS 73 06/24/2012 1131   ALKPHOS 75 02/16/2012 1606   AST 15 02/20/2013 1311   AST 27 06/24/2012 1131   AST 18 02/16/2012  1606   ALT 17 02/20/2013 1311   ALT 19 02/16/2012 1606   BILITOT 1.21* 02/20/2013 1311   BILITOT 1.90* 06/24/2012 1131   BILITOT 0.7 02/16/2012 1606     Results for William Farmer, William Farmer (MRN [id number]) as of 02/20/2013 12:02  Ref. Range 10/14/2011 11:52 02/16/2012 16:06 06/24/2012 11:31  AFP-Tumor Marker Latest Range: 0.0-8.0 ng/mL 8.3 (H) 4.3 5.5  Beta hCG, Tumor Marker Latest Range: < 5.0 mIU/mL < 0.5 < 0.5 < 0.5     Last CT scan from 10/2012 reviewed with patient and showed no evidence of disease.  Impression and Plan:  This is Farmer pleasant 32 year old gentleman with recent diagnosis of testicular cancer.He is currently on active surveillance  His final staging is stage IA disease as he had Farmer T1 N0 disease.  He had no evidence of disease on CT scan from 10/2012. The plan is to continue follow up with CT scan every 4 months, He is due again in March 2014 and I have ordered this today. He will be seen back for Farmer routine visit and CT scan again in July 2014.    [place], [place] 2/24/20144:48 PM

## 2013-02-20 NOTE — Telephone Encounter (Signed)
gv and printed appt schedule for pt for July...pr aware cs will contact with d/t of ct...pt has Barium

## 2013-02-23 LAB — BETA HCG QUANT (REF LAB): Beta hCG, Tumor Marker: <0.5 m[IU]/mL (ref <5.0)

## 2013-02-24 ENCOUNTER — Telehealth: Payer: Self-pay | Admitting: *Deleted

## 2013-02-24 NOTE — Telephone Encounter (Signed)
Message copied by Cooper Render on Fri Feb 24, 2013 12:12 PM ------      Message from: William Farmer      Created: Fri Feb 24, 2013 10:11 AM       Please call pt. Let him know that labs are normal.. ------

## 2013-02-24 NOTE — Telephone Encounter (Signed)
Per NP, notified pt labs results were normal.Pt verbalized understanding no further questions.

## 2013-03-07 ENCOUNTER — Ambulatory Visit (HOSPITAL_COMMUNITY)
Admission: RE | Admit: 2013-03-07 | Discharge: 2013-03-07 | Disposition: A | Discharge status: Home / Self Care | Payer: BC Managed Care – PPO | Source: Ambulatory Visit | Attending: Oncology | Admitting: Oncology

## 2013-03-07 ENCOUNTER — Encounter (HOSPITAL_COMMUNITY): Payer: Self-pay

## 2013-03-07 DIAGNOSIS — Z9079 Acquired absence of other genital organ(s): Secondary | ICD-10-CM | POA: Insufficient documentation

## 2013-03-07 DIAGNOSIS — C6290 Malignant neoplasm of unspecified testis, unspecified whether descended or undescended: Secondary | ICD-10-CM | POA: Insufficient documentation

## 2013-03-07 MED ORDER — IOHEXOL 300 MG/ML  SOLN
100.0000 mL | Freq: Once | INTRAMUSCULAR | Status: AC | PRN
  Administered 2013-03-07: 100 mL via INTRAVENOUS

## 2013-03-07 MED ORDER — IOHEXOL 300 MG/ML  SOLN: 80.0000 mL | Freq: Once | INTRAMUSCULAR | Status: AC | PRN

## 2013-03-08 ENCOUNTER — Telehealth: Payer: Self-pay | Admitting: *Deleted

## 2013-03-08 NOTE — Telephone Encounter (Signed)
Message copied by Reesa Chew on Wed Mar 08, 2013 10:54 AM ------      Message from: Myrtis Ser      Created: Tue Mar 07, 2013  3:15 PM       Please call pt. CT scan looks good (no evidence of cancer). Keep f/u appts as scheduled. ------

## 2013-03-30 ENCOUNTER — Encounter: Payer: Self-pay | Admitting: Internal Medicine

## 2013-04-14 ENCOUNTER — Ambulatory Visit: Payer: BC Managed Care – PPO

## 2013-04-19 ENCOUNTER — Ambulatory Visit (INDEPENDENT_AMBULATORY_CARE_PROVIDER_SITE_OTHER): Payer: BC Managed Care – PPO | Admitting: Family Medicine

## 2013-04-19 VITALS — BP 114/72 | HR 63 | Temp 98.8°F | Resp 16 | Ht 71.0 in | Wt 149.8 lb

## 2013-04-19 DIAGNOSIS — J309 Allergic rhinitis, unspecified: Secondary | ICD-10-CM

## 2013-04-19 MED ORDER — MONTELUKAST SODIUM 10 MG PO TABS: 10.0000 mg | ORAL_TABLET | Freq: Every day | ORAL | Status: DC

## 2013-04-19 MED ORDER — FEXOFENADINE HCL 180 MG PO TABS: 180.0000 mg | ORAL_TABLET | Freq: Every day | ORAL | Status: DC

## 2013-04-19 MED ORDER — FLUTICASONE PROPIONATE 50 MCG/ACT NA SUSP: 2.0000 | Freq: Every day | NASAL | Status: DC

## 2013-04-19 MED ORDER — OLOPATADINE HCL 0.1 % OP SOLN: 1.0000 [drp] | Freq: Two times a day (BID) | OPHTHALMIC | Status: DC

## 2013-04-19 NOTE — Progress Notes (Signed)
[facility] [address] [place] [address] [phone]  Date:  04/19/2013   Name:  William Farmer   DOB:  28-Jan-1981   MRN:  [id number]  PCP:  Default, Provider, MD    Chief Complaint: Allergic Rhinitis  and Sinusitis   History of Present Illness:  William Farmer is a 32 y.o. very pleasant male patient who presents with the following:  He is bothered by severe allergy symptoms.  He was here last week but had to leave due to wait.  He is using claritin, but does not really think it is helping.  He is bothered by itchy eyes, scratchy throat, runny nose, sneezing.  He works around dust at [employer].  He will sneeze a whole lot and sometimes have some nose bleeds.    Not really coughing.    No GI symptom He has noted chills, but no fever Allergy symptoms for about 3 weeks so far.   He is taking claritin, and tried some benadryl but got too sleepy with this.   He had testicular cancer- this is now behind him.  He had surgery 2 years ago.  No other treatment was needed for his cancer, and he is not on any chemotherapeutics  Patient Active Problem List  Diagnosis  . Testicular cancer    Past Medical History  Diagnosis Date  . testicular ca dx'd 08/2011    surg only  . Allergy     No past surgical history on file.  History  Substance Use Topics  . Smoking status: Former Games developer  . Smokeless tobacco: Not on file  . Alcohol Use: No    Family History  Problem Relation Age of Onset  . Diabetes Father   . Diabetes Sister     Allergies  Allergen Reactions  . Alprazolam     unknown  . Bee Venom Swelling  . Codeine Itching    Hives, twitching  . Iodine     unknown    Medication list has been reviewed and updated.  Current Outpatient Prescriptions on File Prior to Visit  Medication Sig Dispense Refill  . Ascorbic Acid (VITAMIN C) 100 MG tablet Take 100 mg by mouth daily.      Marland Kitchen ibuprofen (ADVIL,MOTRIN) 600 MG tablet       . loratadine  (CLARITIN) 10 MG tablet Take 10 mg by mouth daily.       No current facility-administered medications on file prior to visit.    Review of Systems:  As per HPI- otherwise negative.   Physical Examination: Filed Vitals:   04/19/13 1404  BP: 114/72  Pulse: 63  Temp: 98.8 F (37.1 C)  Resp: 16   Filed Vitals:   04/19/13 1404  Height: 5\' 11"  (1.803 m)  Weight: 149 lb 12.8 oz (67.949 kg)   Body mass index is 20.9 kg/(m^2). Ideal Body Weight: Weight in (lb) to have BMI = 25: 178.9  GEN: WDWN, NAD, Non-toxic, A & O x 3 HEENT: Atraumatic, Normocephalic. Neck supple. No masses, No LAD.  Bilateral TM wnl, oropharynx normal.  PEERL,EOMI.   Nasal cavity is congested.  No sinus pain upon percussion Ears and Nose: No external deformity. CV: RRR, No M/G/R. No JVD. No thrill. No extra heart sounds. PULM: CTA B, no wheezes, crackles, rhonchi. No retractions. No resp. distress. No accessory muscle use. EXTR: No c/c/e NEURO Normal gait.  PSYCH: Normally interactive. Conversant. Not depressed or anxious appearing.  Calm demeanor.  Assessment and Plan: Allergic rhinitis - Plan: olopatadine (PATANOL) 0.1 % ophthalmic solution, fluticasone (FLONASE) 50 MCG/ACT nasal spray, fexofenadine (ALLEGRA) 180 MG tablet, montelukast (SINGULAIR) 10 MG tablet  Change to allegra, and add patanol and singulair.  flonase rx, but he does not really like nasal sprays so he may not use this.  Let me know if not helpful- Sooner if worse.     Signed Abbe Amsterdam, MD

## 2013-04-19 NOTE — Patient Instructions (Addendum)
You can try allegra for your allergies instead of claritin. You can also use the sinulair pill once a day.  Also try the nasal spray if you like, and the eye drops. If the eye drops are too expensive try the "genteal gel" OTC.    Let me know if you are not feeling better in the next few days- Sooner if worse.

## 2013-06-26 ENCOUNTER — Telehealth: Payer: Self-pay | Admitting: Oncology

## 2013-06-26 NOTE — Telephone Encounter (Signed)
pt came in and wanted to cancel appts and did not want to r/s

## 2013-06-28 ENCOUNTER — Other Ambulatory Visit: Payer: BC Managed Care – PPO | Admitting: Lab

## 2013-06-28 ENCOUNTER — Ambulatory Visit: Payer: BC Managed Care – PPO | Admitting: Oncology

## 2013-07-05 ENCOUNTER — Telehealth: Payer: Self-pay | Admitting: *Deleted

## 2013-07-05 NOTE — Telephone Encounter (Signed)
Faxed order for CT abd and pelvis /W contrast to [employer]. For verification so they may pay for scan.

## 2013-07-10 ENCOUNTER — Ambulatory Visit (HOSPITAL_COMMUNITY): Payer: BC Managed Care – PPO

## 2013-07-10 ENCOUNTER — Telehealth: Payer: Self-pay | Admitting: *Deleted

## 2013-07-10 NOTE — Telephone Encounter (Signed)
William Farmer [phone]) with [facility]uling service called requesting an order for CT Abdomen Pelvis with contrast.  Order needed to send to [facility].  Order must be faxed from [facility] to [facility] in order for scans to be performed.  Order needs to be faxed to [phone].  CT scheduled for July 31, 2013.  Will notify providers.  Orders noted in EPIC have been cancelled.

## 2013-07-11 ENCOUNTER — Other Ambulatory Visit: Payer: Self-pay | Admitting: Oncology

## 2013-07-11 DIAGNOSIS — C6290 Malignant neoplasm of unspecified testis, unspecified whether descended or undescended: Secondary | ICD-10-CM

## 2013-07-31 ENCOUNTER — Telehealth: Payer: Self-pay | Admitting: *Deleted

## 2013-07-31 ENCOUNTER — Other Ambulatory Visit: Payer: Self-pay | Admitting: *Deleted

## 2013-07-31 DIAGNOSIS — C6290 Malignant neoplasm of unspecified testis, unspecified whether descended or undescended: Secondary | ICD-10-CM

## 2013-07-31 MED ORDER — PREDNISONE 50 MG PO TABS: ORAL_TABLET | ORAL | Status: DC

## 2013-07-31 NOTE — Telephone Encounter (Signed)
Patient calls requesting prednisone refill.  Reports he is for CT scans on 08-14-2013 at 8:45 am.  Would like prednisone 50 mg called to [facility] at [address]  Has an empty bottle from previous scans reporting he has always pre-medicated.  Recalls breaking out in hives but no trouble breathing with the iodine.  Will send order for refill to [facility] on [address]

## 2013-07-31 NOTE — Telephone Encounter (Signed)
Received vm call from [facility] stating pt has an allergy to iodine & wants to use his insurance benefits & get his CT outpt with [facility] & wants to know if his reaction was mild or severe.  Return call made to [phone]  X [phone] & spoke to gentleman & informed that we did not have any documentation of severity of allergy.  He will contact the pt. & have him call us if necessary.  They are concerned about doing outpt CT if he has an iodine allergy.

## 2013-10-30 ENCOUNTER — Ambulatory Visit (INDEPENDENT_AMBULATORY_CARE_PROVIDER_SITE_OTHER): Payer: BC Managed Care – PPO | Admitting: Family Medicine

## 2013-10-30 VITALS — BP 124/72 | HR 60 | Temp 98.5°F | Resp 18 | Ht 71.0 in | Wt 151.0 lb

## 2013-10-30 DIAGNOSIS — H1030 Unspecified acute conjunctivitis, unspecified eye: Secondary | ICD-10-CM

## 2013-10-30 DIAGNOSIS — H109 Unspecified conjunctivitis: Secondary | ICD-10-CM

## 2013-10-30 MED ORDER — TOBRAMYCIN-DEXAMETHASONE 0.3-0.1 % OP SUSP: 1.0000 [drp] | Freq: Three times a day (TID) | OPHTHALMIC | Status: DC

## 2013-10-30 NOTE — Patient Instructions (Signed)
Put one drop in right eye three times a day for 5 days

## 2013-10-30 NOTE — Progress Notes (Signed)
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  He has had burning intense pain in the eye ever since as if he had a eyelash trapped under the lid.  Objective:  NAD Mild injection of right eye EOMI PERLA Fundus normal Lid eversion normal flourescein uptake diffuse medial and lateral to cornea, sparing the cornea  Assessment:  Traumatic conjunctivitis with slow resolution.  Fortunately does not wear contacts.  Plan:  tobradex tid x 3 days  KL

## 2014-03-10 ENCOUNTER — Ambulatory Visit (INDEPENDENT_AMBULATORY_CARE_PROVIDER_SITE_OTHER): Payer: BC Managed Care – PPO | Admitting: Emergency Medicine

## 2014-03-10 ENCOUNTER — Ambulatory Visit: Payer: BC Managed Care – PPO

## 2014-03-10 VITALS — BP 110/68 | HR 66 | Temp 97.9°F | Resp 18 | Ht 71.0 in | Wt 160.0 lb

## 2014-03-10 DIAGNOSIS — M545 Low back pain, unspecified: Secondary | ICD-10-CM

## 2014-03-10 DIAGNOSIS — M542 Cervicalgia: Secondary | ICD-10-CM

## 2014-03-10 DIAGNOSIS — C6290 Malignant neoplasm of unspecified testis, unspecified whether descended or undescended: Secondary | ICD-10-CM

## 2014-03-10 MED ORDER — CYCLOBENZAPRINE HCL 10 MG PO TABS: ORAL_TABLET | ORAL | Status: DC

## 2014-03-10 MED ORDER — MELOXICAM 7.5 MG PO TABS: 7.5000 mg | ORAL_TABLET | Freq: Every day | ORAL | Status: DC

## 2014-03-10 NOTE — Progress Notes (Addendum)
   Subjective:    Patient ID: William Farmer, male    DOB: [birth year], 33 y.o.   MRN: [id number] This chart was scribed for Arlyss Queen, MD by Vernell Barrier, Medical Scribe. This patient's care was started at 8:24 AM.  Neck Pain   Back Pain   HPI Comments: William Farmer is a 33 y.o. male who presents to the [facility] complaining of neck pain, constant lower back pain, and intermittent upper back pain; does not radiate. States he injured back 6 days ago lifting suitcases up 3 flights of stairs to his home. States he went and rolled off the bed slowly attempting to "pop his back" and injured it even more. Neck pain began 2 days ago; unsure of correlation to present back pain. States he injured his back in 2006 lifting a bass amplifier into his car. Denies leg pain.  Review of Systems  Musculoskeletal: Positive for back pain and neck pain.   Objective:   Physical Exam  CONSTITUTIONAL: Well developed/well nourished HEAD: Normocephalic/atraumatic EYES: EOMI/PERRL ENMT: Mucous membranes moist NECK: supple no meningeal signs SPINE:entire spine nontender CV: S1/S2 noted, no murmurs/rubs/gallops noted LUNGS: Lungs are clear to auscultation bilaterally, no apparent distress ABDOMEN: soft, nontender, no rebound or guarding GU:no cva tenderness NEURO: Pt is awake/alert, moves all extremitiesx4, DTRs nl and symmetric EXTREMITIES: pulses normal, full ROM SKIN: warm, color normal PSYCH: no abnormalities of mood noted BACK: tender at peri-cervical and peri-lumbar muscles. Reflexes and strength normal.  Primary [facility] reading by Dr. Everlene Farrier: normal  Assessment & Plan:  We'll treat with Flexeril an Mobic. He was given a back manual patient has testicular cancer has not followed up with his oncologist and has not followed up with his urologist. Appointment made with Dr. Gaynelle Arabian for reassessment I personally performed the services described in this documentation, which was  scribed in my presence. The recorded information has been reviewed and is accurate.

## 2014-03-10 NOTE — Patient Instructions (Signed)
Please follow the instructions on your neck and back manually.

## 2014-03-21 ENCOUNTER — Other Ambulatory Visit (HOSPITAL_COMMUNITY): Payer: Self-pay | Admitting: Urology

## 2014-03-21 DIAGNOSIS — C6290 Malignant neoplasm of unspecified testis, unspecified whether descended or undescended: Secondary | ICD-10-CM

## 2014-03-23 ENCOUNTER — Ambulatory Visit (HOSPITAL_COMMUNITY): Payer: BC Managed Care – PPO

## 2014-03-23 ENCOUNTER — Ambulatory Visit (HOSPITAL_COMMUNITY): Admission: RE | Admit: 2014-03-23 | Payer: BC Managed Care – PPO | Source: Ambulatory Visit

## 2014-05-07 LAB — BASIC METABOLIC PANEL
BUN: 17 mg/dL (ref 4–21)
Creatinine: 1.3 mg/dL (ref 0.6–1.3)
GLUCOSE: 109 mg/dL
POTASSIUM: 5.5 mmol/L — AB (ref 3.4–5.3)
SODIUM: 130 mmol/L — AB (ref 137–147)

## 2014-06-15 ENCOUNTER — Encounter: Payer: Self-pay | Admitting: Family Medicine

## 2014-07-27 ENCOUNTER — Ambulatory Visit: Payer: Self-pay

## 2014-07-27 ENCOUNTER — Other Ambulatory Visit: Payer: Self-pay | Admitting: Occupational Medicine

## 2014-07-27 DIAGNOSIS — M25532 Pain in left wrist: Secondary | ICD-10-CM

## 2014-09-22 ENCOUNTER — Emergency Department (HOSPITAL_COMMUNITY)
Admission: EM | Admit: 2014-09-22 | Discharge: 2014-09-22 | Disposition: A | Discharge status: Home / Self Care | Payer: BC Managed Care – PPO | Attending: Emergency Medicine | Admitting: Emergency Medicine

## 2014-09-22 ENCOUNTER — Encounter (HOSPITAL_COMMUNITY): Payer: Self-pay | Admitting: Emergency Medicine

## 2014-09-22 DIAGNOSIS — M546 Pain in thoracic spine: Secondary | ICD-10-CM | POA: Diagnosis not present

## 2014-09-22 DIAGNOSIS — M62838 Other muscle spasm: Secondary | ICD-10-CM | POA: Insufficient documentation

## 2014-09-22 DIAGNOSIS — Z87891 Personal history of nicotine dependence: Secondary | ICD-10-CM | POA: Insufficient documentation

## 2014-09-22 DIAGNOSIS — Z8547 Personal history of malignant neoplasm of testis: Secondary | ICD-10-CM | POA: Diagnosis not present

## 2014-09-22 DIAGNOSIS — IMO0002 Reserved for concepts with insufficient information to code with codable children: Secondary | ICD-10-CM | POA: Insufficient documentation

## 2014-09-22 DIAGNOSIS — Z79899 Other long term (current) drug therapy: Secondary | ICD-10-CM | POA: Diagnosis not present

## 2014-09-22 DIAGNOSIS — Z791 Long term (current) use of non-steroidal anti-inflammatories (NSAID): Secondary | ICD-10-CM | POA: Diagnosis not present

## 2014-09-22 DIAGNOSIS — M549 Dorsalgia, unspecified: Secondary | ICD-10-CM | POA: Diagnosis present

## 2014-09-22 MED ORDER — METHOCARBAMOL 500 MG PO TABS
500.0000 mg | ORAL_TABLET | Freq: Once | ORAL | Status: AC
  Administered 2014-09-22: 500 mg via ORAL
  Filled 2014-09-22: qty 1

## 2014-09-22 MED ORDER — METHOCARBAMOL 500 MG PO TABS: 500.0000 mg | ORAL_TABLET | Freq: Two times a day (BID) | ORAL | Status: DC

## 2014-09-22 NOTE — ED Notes (Signed)
Pt states that he has had back pain for 1 year. States that he works at [employer] and lifts heavy objects. Upper left side of back bothers him the most.

## 2014-09-22 NOTE — ED Provider Notes (Signed)
CSN: [id number]     Arrival date & time 09/22/14  1224 History  This chart was scribed for non-physician practitioner working with No att. providers found by Mercy Moore, ED Scribe. This patient was seen in room TR11C/TR11C and the patient's care was started at 2:25 PM.   Chief Complaint  Patient presents with  . Back Pain   The history is provided by the patient. No language interpreter was used.   HPI Comments: PHILLIPS GOULETTE is a 33 y.o. male who presents to the Emergency Department complaining of left thoracic back pain ongoing for one year. Patient describes intermittent sharp pain exacerbated with deep breathing, yawning, laughing and lifting the left arm. Patient reports that recently the pain has now began radiating anteriorly to his left chest. Patient denies direct injury or causative trauma.  Patient employed at [employer] and reports repetitive heavy lifting throughout his work day.  Patient denies vomiting, nausea, rash, leg swelling, or shortness of breath.   Patient reports historical treatment muscle relaxer, but he discontinued use due to side effects. He reports alternative treatment with ibuprofen, but only reports temporary relief.  Patient denies history of DTVs or PEs. PERC negative   Past Medical History  Diagnosis Date  . testicular ca dx'd 08/2011    surg only  . Allergy    History reviewed. No pertinent past surgical history. Family History  Problem Relation Age of Onset  . Diabetes Father   . Diabetes Sister    History  Substance Use Topics  . Smoking status: Former Research scientist (life sciences)  . Smokeless tobacco: Not on file  . Alcohol Use: No    Review of Systems  Constitutional: Negative for fever and chills.  Respiratory: Negative for cough and shortness of breath.   Cardiovascular: Negative for chest pain.  Gastrointestinal: Negative for vomiting, diarrhea and constipation.  Genitourinary: Negative for dysuria and enuresis.  Musculoskeletal: Positive for back pain.  Negative for gait problem.  Skin: Negative for rash.  Neurological: Negative for weakness and numbness.  All other systems reviewed and are negative.   Allergies  Iodine; Alprazolam; Bee venom; and Codeine  Home Medications   Prior to Admission medications   Medication Sig Start Date End Date Taking? Authorizing Provider  Ascorbic Acid (VITAMIN C) 100 MG tablet Take 100 mg by mouth daily.    Historical Provider, MD  cyclobenzaprine (FLEXERIL) 10 MG tablet One half to one 3 times a day as needed as a muscle relaxant 03/10/14   Darlyne Russian, MD  fexofenadine (ALLEGRA) 180 MG tablet Take 1 tablet (180 mg total) by mouth daily. 04/19/13   Gay Filler Copland, MD  fluticasone (FLONASE) 50 MCG/ACT nasal spray Place 2 sprays into the nose daily. 04/19/13   Darreld Mclean, MD  ibuprofen (ADVIL,MOTRIN) 600 MG tablet  04/11/12   Historical Provider, MD  meloxicam (MOBIC) 7.5 MG tablet Take 1 tablet (7.5 mg total) by mouth daily. 03/10/14   Darlyne Russian, MD  methocarbamol (ROBAXIN) 500 MG tablet Take 1 tablet (500 mg total) by mouth 2 (two) times daily. 09/22/14   Lysha Schrade L Fallon Howerter, PA-C  montelukast (SINGULAIR) 10 MG tablet Take 1 tablet (10 mg total) by mouth at bedtime. 04/19/13   Gay Filler Copland, MD  olopatadine (PATANOL) 0.1 % ophthalmic solution Place 1 drop into both eyes 2 (two) times daily. 04/19/13   Gay Filler Copland, MD  predniSONE (DELTASONE) 50 MG tablet Take 1 tablet by mouth at 7:45 pm(13 hrs), 1:45 am (7 hrs)  and 7:45 am (1 hr) before scans 07/31/13   Wyatt Portela, MD  tobramycin-dexamethasone [facility]) ophthalmic solution Place 1 drop into the right eye 3 (three) times daily. 10/30/13   Robyn Haber, MD   Triage Vitals: BP 133/77  Pulse 76  Temp(Src) 98.5 F (36.9 C) (Oral)  Resp 16  SpO2 100%  Physical Exam  Nursing note and vitals reviewed. Constitutional: He is oriented to person, place, and time. He appears well-developed and well-nourished. No distress.  HENT:    Head: Normocephalic and atraumatic.  Right Ear: External ear normal.  Left Ear: External ear normal.  Nose: Nose normal.  Mouth/Throat: Oropharynx is clear and moist. No oropharyngeal exudate.  Eyes: Conjunctivae and EOM are normal. Pupils are equal, round, and reactive to light.  Neck: Normal range of motion. Neck supple.  Cardiovascular: Normal rate, regular rhythm, normal heart sounds and intact distal pulses.   Pulmonary/Chest: Effort normal and breath sounds normal. No respiratory distress.  Abdominal: Soft. There is no tenderness.  Musculoskeletal: Normal range of motion.       Back:  Neurological: He is alert and oriented to person, place, and time. He has normal strength. No cranial nerve deficit. Gait normal. GCS eye subscore is 4. GCS verbal subscore is 5. GCS motor subscore is 6.  Sensation grossly intact.  No pronator drift.  Bilateral heel-knee-shin intact.  Skin: Skin is warm and dry. No rash noted. He is not diaphoretic.  Psychiatric: He has a normal mood and affect.    ED Course  Procedures (including critical care time) Medications  methocarbamol (ROBAXIN) tablet 500 mg (500 mg Oral Given 09/22/14 1327)     COORDINATION OF CARE: 12:48 PM- Will prescribe muscle relaxer. Discussed treatment plan with patient at bedside and patient agreed to plan.   Labs Review Labs Reviewed - No data to display  Imaging Review No results found.   EKG Interpretation None      MDM   Final diagnoses:  Left-sided thoracic back pain  Muscle spasm of left shoulder area    Filed Vitals:   09/22/14 1358  BP: 118/72  Pulse: 58  Temp: 98.1 F (36.7 C)  Resp: 16   Afebrile, NAD, non-toxic appearing, AAOx4.  Patient with back pain.  Spasm noted to left scapular region. No neurological deficits and normal neuro exam.  Patient can walk but states is painful.  No loss of bowel or bladder control.  No concern for cauda equina.  No fever, night sweats, weight loss, h/o cancer,  IVDU.  RICE protocol and pain medicine indicated and discussed with patient.    I personally performed the services described in this documentation, which was scribed in my presence. The recorded information has been reviewed and is accurate.    [place], PA-C 09/22/14 1425

## 2014-09-22 NOTE — Discharge Instructions (Signed)
Please follow up with your primary care physician in 1-2 days. If you do not have one please call the [facility] number listed above. Please take pain medication and/or muscle relaxants as prescribed and as needed for pain. Please do not drive on narcotic pain medication or on muscle relaxants. Please read all discharge instructions and return precautions.    Back Pain, Adult Low back pain is very common. About 1 in 5 people have back pain.The cause of low back pain is rarely dangerous. The pain often gets better over time.About half of people with a sudden onset of back pain feel better in just 2 weeks. About 8 in 10 people feel better by 6 weeks.  CAUSES Some common causes of back pain include:  Strain of the muscles or ligaments supporting the spine.  Wear and tear (degeneration) of the spinal discs.  Arthritis.  Direct injury to the back. DIAGNOSIS Most of the time, the direct cause of low back pain is not known.However, back pain can be treated effectively even when the exact cause of the pain is unknown.Answering your caregiver's questions about your overall health and symptoms is one of the most accurate ways to make sure the cause of your pain is not dangerous. If your caregiver needs more information, he or she may order lab work or imaging tests (X-rays or MRIs).However, even if imaging tests show changes in your back, this usually does not require surgery. HOME CARE INSTRUCTIONS For many people, back pain returns.Since low back pain is rarely dangerous, it is often a condition that people can learn to [facility] their own.   Remain active. It is stressful on the back to sit or stand in one place. Do not sit, drive, or stand in one place for more than 30 minutes at a time. Take short walks on level surfaces as soon as pain allows.Try to increase the length of time you walk each day.  Do not stay in bed.Resting more than 1 or 2 days can delay your  recovery.  Do not avoid exercise or work.Your body is made to move.It is not dangerous to be active, even though your back may hurt.Your back will likely heal faster if you return to being active before your pain is gone.  Pay attention to your body when you bend and lift. Many people have less discomfortwhen lifting if they bend their knees, keep the load close to their bodies,and avoid twisting. Often, the most comfortable positions are those that put less stress on your recovering back.  Find a comfortable position to sleep. Use a firm mattress and lie on your side with your knees slightly bent. If you lie on your back, put a pillow under your knees.  Only take over-the-counter or prescription medicines as directed by your caregiver. Over-the-counter medicines to reduce pain and inflammation are often the most helpful.Your caregiver may prescribe muscle relaxant drugs.These medicines help dull your pain so you can more quickly return to your normal activities and healthy exercise.  Put ice on the injured area.  Put ice in a plastic bag.  Place a towel between your skin and the bag.  Leave the ice on for 15-20 minutes, 03-04 times a day for the first 2 to 3 days. After that, ice and heat may be alternated to reduce pain and spasms.  Ask your caregiver about trying back exercises and gentle massage. This may be of some benefit.  Avoid feeling anxious or stressed.Stress increases muscle tension  and can worsen back pain.It is important to recognize when you are anxious or stressed and learn ways to manage it.Exercise is a great option. SEEK MEDICAL CARE IF:  You have pain that is not relieved with rest or medicine.  You have pain that does not improve in 1 week.  You have new symptoms.  You are generally not feeling well. SEEK IMMEDIATE MEDICAL CARE IF:   You have pain that radiates from your back into your legs.  You develop new bowel or bladder control problems.  You  have unusual weakness or numbness in your arms or legs.  You develop nausea or vomiting.  You develop abdominal pain.  You feel faint. Document Released: 12/14/2005 Document Revised: 06/14/2012 Document Reviewed: 04/17/2014 [facility] Patient Information 2015 [place], [place]. This information is not intended to replace advice given to you by your health care provider. Make sure you discuss any questions you have with your health care provider. Muscle Cramps and Spasms Muscle cramps and spasms occur when a muscle or muscles tighten and you have no control over this tightening (involuntary muscle contraction). They are a common problem and can develop in any muscle. The most common place is in the calf muscles of the leg. Both muscle cramps and muscle spasms are involuntary muscle contractions, but they also have differences:   Muscle cramps are sporadic and painful. They may last a few seconds to a quarter of an hour. Muscle cramps are often more forceful and last longer than muscle spasms.  Muscle spasms may or may not be painful. They may also last just a few seconds or much longer. CAUSES  It is uncommon for cramps or spasms to be due to a serious underlying problem. In many cases, the cause of cramps or spasms is unknown. Some common causes are:   Overexertion.   Overuse from repetitive motions (doing the same thing over and over).   Remaining in a certain position for a long period of time.   Improper preparation, form, or technique while performing a sport or activity.   Dehydration.   Injury.   Side effects of some medicines.   Abnormally low levels of the salts and ions in your blood (electrolytes), especially potassium and calcium. This could happen if you are taking water pills (diuretics) or you are pregnant.  Some underlying medical problems can make it more likely to develop cramps or spasms. These include, but are not limited to:   Diabetes.   Parkinson  disease.   Hormone disorders, such as thyroid problems.   Alcohol abuse.   Diseases specific to muscles, joints, and bones.   Blood vessel disease where not enough blood is getting to the muscles.  HOME CARE INSTRUCTIONS   Stay well hydrated. Drink enough water and fluids to keep your urine clear or pale yellow.  It may be helpful to massage, stretch, and relax the affected muscle.  For tight or tense muscles, use a warm towel, heating pad, or hot shower water directed to the affected area.  If you are sore or have pain after a cramp or spasm, applying ice to the affected area may relieve discomfort.  Put ice in a plastic bag.  Place a towel between your skin and the bag.  Leave the ice on for 15-20 minutes, 03-04 times a day.  Medicines used to treat a known cause of cramps or spasms may help reduce their frequency or severity. Only take over-the-counter or prescription medicines as directed by your caregiver.  SEEK MEDICAL CARE IF:  Your cramps or spasms get more severe, more frequent, or do not improve over time.  MAKE SURE YOU:   Understand these instructions.  Will watch your condition.  Will get help right away if you are not doing well or get worse. Document Released: 06/05/2002 Document Revised: 04/10/2013 Document Reviewed: 11/30/2012 [facility] Patient Information 2015 [place], [place]. This information is not intended to replace advice given to you by your health care provider. Make sure you discuss any questions you have with your health care provider.

## 2014-09-23 NOTE — ED Provider Notes (Signed)
Medical screening examination/treatment/procedure(s) were performed by non-physician practitioner and as supervising physician I was immediately available for consultation/collaboration.   EKG Interpretation None        Tanna Furry, MD 09/23/14 1541

## 2014-11-27 ENCOUNTER — Emergency Department (HOSPITAL_COMMUNITY)
Admission: EM | Admit: 2014-11-27 | Discharge: 2014-11-28 | Disposition: A | Discharge status: Home / Self Care | Payer: BC Managed Care – PPO | Attending: Emergency Medicine | Admitting: Emergency Medicine

## 2014-11-27 ENCOUNTER — Emergency Department (HOSPITAL_COMMUNITY): Payer: BC Managed Care – PPO

## 2014-11-27 ENCOUNTER — Encounter (HOSPITAL_COMMUNITY): Payer: Self-pay | Admitting: Emergency Medicine

## 2014-11-27 DIAGNOSIS — R110 Nausea: Secondary | ICD-10-CM | POA: Diagnosis not present

## 2014-11-27 DIAGNOSIS — Z87891 Personal history of nicotine dependence: Secondary | ICD-10-CM | POA: Insufficient documentation

## 2014-11-27 DIAGNOSIS — R0789 Other chest pain: Secondary | ICD-10-CM | POA: Insufficient documentation

## 2014-11-27 DIAGNOSIS — Z8547 Personal history of malignant neoplasm of testis: Secondary | ICD-10-CM | POA: Diagnosis not present

## 2014-11-27 DIAGNOSIS — Z79899 Other long term (current) drug therapy: Secondary | ICD-10-CM | POA: Diagnosis not present

## 2014-11-27 DIAGNOSIS — R079 Chest pain, unspecified: Secondary | ICD-10-CM | POA: Diagnosis present

## 2014-11-27 DIAGNOSIS — Z7951 Long term (current) use of inhaled steroids: Secondary | ICD-10-CM | POA: Insufficient documentation

## 2014-11-27 LAB — I-STAT TROPONIN, ED: TROPONIN I, POC: 0 ng/mL (ref 0.00–0.08)

## 2014-11-27 LAB — BASIC METABOLIC PANEL
Anion gap: 13 (ref 5–15)
BUN: 13 mg/dL (ref 6–23)
CHLORIDE: 99 meq/L (ref 96–112)
CO2: 25 meq/L (ref 19–32)
Calcium: 9.4 mg/dL (ref 8.4–10.5)
Creatinine, Ser: 1.21 mg/dL (ref 0.50–1.35)
GFR calc Af Amer: >90 mL/min (ref >90)
GFR calc non Af Amer: 78 mL/min — ABNORMAL LOW (ref >90)
GLUCOSE: 80 mg/dL (ref 70–99)
POTASSIUM: 4.4 meq/L (ref 3.7–5.3)
SODIUM: 137 meq/L (ref 137–147)

## 2014-11-27 LAB — CBC
HEMATOCRIT: 41.2 % (ref 39.0–52.0)
HEMOGLOBIN: 13.9 g/dL (ref 13.0–17.0)
MCH: 29.7 pg (ref 26.0–34.0)
MCHC: 33.7 g/dL (ref 30.0–36.0)
MCV: 88 fL (ref 78.0–100.0)
Platelets: 224 10*3/uL (ref 150–400)
RBC: 4.68 MIL/uL (ref 4.22–5.81)
RDW: 13 % (ref 11.5–15.5)
WBC: 4.3 10*3/uL (ref 4.0–10.5)

## 2014-11-27 NOTE — ED Notes (Addendum)
Pt from home with reports of cp x2 days, pt states cp was intermittent yesterday and has been constant since. Denies any n/v/d or diaphoresis. Per pt mother- pt has been working longer shifts at [employer] and doing heavy lifting. Pt noted to have tenderness to left side of chest. nad noted. axox 4.

## 2014-11-27 NOTE — ED Notes (Signed)
Pt . reports intermittent left chest pain onset yesterday , denies SOB , no nausea or diaphoresis .

## 2014-11-27 NOTE — ED Provider Notes (Signed)
CSN: [id number]     Arrival date & time 11/27/14  2152 History  This chart was scribed for Leota Jacobsen, MD by Marlowe Kays, ED Scribe. This patient was seen in room B17C/B17C and the patient's care was started at 11:18 PM.  Chief Complaint  Patient presents with  . Chest Pain   HPI  HPI Comments:  William Farmer is a 33 y.o. male who presents to the Emergency Department complaining of new onset constant left-sided chest pain that started yesterday. He states he was driving earlier today when the pain began again causing him to pull over and "take a breather". He reports it began driving again until he got home. Moving and deep breathing makes the pain worse. Pt works at [employer] and states he lifts heavy objects frequently. Denies fever, chills, cough or diaphoresis. No leg pain or swelling. Sx persistent and no tx used pta  Past Medical History  Diagnosis Date  . testicular ca dx'd 08/2011    surg only  . Allergy    Past Surgical History  Procedure Laterality Date  . Testicular cancer surgery     Family History  Problem Relation Age of Onset  . Diabetes Father   . Diabetes Sister    History  Substance Use Topics  . Smoking status: Former Research scientist (life sciences)  . Smokeless tobacco: Not on file  . Alcohol Use: No    Review of Systems  Constitutional: Negative for fever and chills.  Respiratory: Negative for cough.   Gastrointestinal: Positive for nausea.  All other systems reviewed and are negative.   Allergies  Iodine; Alprazolam; Bee venom; and Codeine  Home Medications   Prior to Admission medications   Medication Sig Start Date End Date Taking? Authorizing Provider  Cyanocobalamin (VITAMIN B-12 PO) Take 1 tablet by mouth daily as needed (energy).   Yes Historical Provider, MD  cyclobenzaprine (FLEXERIL) 10 MG tablet One half to one 3 times a day as needed as a muscle relaxant 03/10/14  Yes Darlyne Russian, MD  fexofenadine (ALLEGRA) 180 MG tablet Take 1 tablet (180 mg total) by  mouth daily. Patient taking differently: Take 180 mg by mouth daily as needed for allergies.  04/19/13  Yes Gay Filler Copland, MD  fluticasone (FLONASE) 50 MCG/ACT nasal spray Place 2 sprays into the nose daily. Patient taking differently: Place 2 sprays into the nose daily as needed for allergies.  04/19/13  Yes Gay Filler Copland, MD  ibuprofen (ADVIL,MOTRIN) 600 MG tablet Take 600 mg by mouth every 6 (six) hours as needed for moderate pain.  04/11/12  Yes Historical Provider, MD  montelukast (SINGULAIR) 10 MG tablet Take 1 tablet (10 mg total) by mouth at bedtime. Patient taking differently: Take 10 mg by mouth daily as needed (allergies).  04/19/13  Yes Jessica C Copland, MD  olopatadine (PATANOL) 0.1 % ophthalmic solution Place 1 drop into both eyes 2 (two) times daily. Patient taking differently: Place 1 drop into both eyes 2 (two) times daily as needed for allergies.  04/19/13  Yes Gay Filler Copland, MD  tobramycin-dexamethasone (TOBRADEX) ophthalmic solution Place 1 drop into the right eye 3 (three) times daily. Patient taking differently: Place 1 drop into the right eye daily as needed (irration).  10/30/13  Yes Robyn Haber, MD  Ascorbic Acid (VITAMIN C) 100 MG tablet Take 100 mg by mouth daily.    Historical Provider, MD  meloxicam (MOBIC) 7.5 MG tablet Take 1 tablet (7.5 mg total) by mouth daily. Patient not  taking: Reported on 11/27/2014 03/10/14   Darlyne Russian, MD  methocarbamol (ROBAXIN) 500 MG tablet Take 1 tablet (500 mg total) by mouth 2 (two) times daily. Patient not taking: Reported on 11/27/2014 09/22/14   Anderson Malta L Piepenbrink, PA-C  predniSONE (DELTASONE) 50 MG tablet Take 1 tablet by mouth at 7:45 pm(13 hrs), 1:45 am (7 hrs) and 7:45 am (1 hr) before scans Patient not taking: Reported on 11/27/2014 07/31/13   Wyatt Portela, MD   Triage Vitals: BP 123/74 mmHg  Pulse 58  Temp(Src) 97.8 F (36.6 C) (Oral)  Resp 16  Ht 5\' 11"  (1.803 m)  Wt 160 lb (72.576 kg)  BMI 22.33 kg/m2   SpO2 100% Physical Exam  Constitutional: He is oriented to person, place, and time. He appears well-developed and well-nourished.  Non-toxic appearance. No distress.  HENT:  Head: Normocephalic and atraumatic.  Eyes: Conjunctivae, EOM and lids are normal. Pupils are equal, round, and reactive to light.  Neck: Normal range of motion. Neck supple. No tracheal deviation present. No thyroid mass present.  Cardiovascular: Normal rate, regular rhythm and normal heart sounds.  Exam reveals no gallop.   No murmur heard. Pulmonary/Chest: Effort normal and breath sounds normal. No stridor. No respiratory distress. He has no decreased breath sounds. He has no wheezes. He has no rhonchi. He has no rales.  Abdominal: Soft. Normal appearance and bowel sounds are normal. He exhibits no distension. There is no tenderness. There is no rebound and no CVA tenderness.  Musculoskeletal: Normal range of motion. He exhibits no edema or tenderness.  Tender to palpation to left-sided chest wall. No crepitus.  Neurological: He is alert and oriented to person, place, and time. He has normal strength. No cranial nerve deficit or sensory deficit. GCS eye subscore is 4. GCS verbal subscore is 5. GCS motor subscore is 6.  Skin: Skin is warm and dry. No abrasion and no rash noted.  Psychiatric: He has a normal mood and affect. His speech is normal and behavior is normal.  Nursing note and vitals reviewed.   ED Course  Procedures (including critical care time) DIAGNOSTIC STUDIES: Oxygen Saturation is 100% on RA, normal by my interpretation.   COORDINATION OF CARE: 11:23 PM- Informed pt of negative test results. Will prescribe anti-inflammatory and muscle relaxer. Pt verbalizes understanding and agrees to plan.  Medications - No data to display  Labs Review Labs Reviewed  BASIC METABOLIC PANEL - Abnormal; Notable for the following:    GFR calc non Af Amer 78 (*)    All other components within normal limits  CBC   I-STAT TROPOININ, ED    Imaging Review No results found.   EKG Interpretation   Date/Time:  Tuesday November 27 2014 22:06:13 EST Ventricular Rate:  55 PR Interval:  148 QRS Duration: 80 QT Interval:  384 QTC Calculation: 367 R Axis:   51 Text Interpretation:  Sinus bradycardia Early repolarization Otherwise  normal ECG No significant change since last tracing Confirmed by Crystie Yanko   MD, Kolbi Altadonna ([id number]) on 11/27/2014 11:18:14 PM      MDM   Final diagnoses:  Chest pain   Pt to be tx for chest wall pain.no concern for acs or pe. Stable for d/c  I personally performed the services described in this documentation, which was scribed in my presence. The recorded information has been reviewed and is accurate.    Leota Jacobsen, MD 11/28/14 0001

## 2014-11-28 MED ORDER — MELOXICAM 7.5 MG PO TABS: 7.5000 mg | ORAL_TABLET | Freq: Every day | ORAL | Status: DC

## 2014-11-28 MED ORDER — METHOCARBAMOL 500 MG PO TABS: 500.0000 mg | ORAL_TABLET | Freq: Two times a day (BID) | ORAL | Status: DC

## 2014-11-28 NOTE — Discharge Instructions (Signed)

## 2015-04-17 ENCOUNTER — Ambulatory Visit (INDEPENDENT_AMBULATORY_CARE_PROVIDER_SITE_OTHER): Payer: BLUE CROSS/BLUE SHIELD | Admitting: Emergency Medicine

## 2015-04-17 VITALS — HR 111 | Temp 102.6°F | Resp 16 | Ht 71.0 in | Wt 167.1 lb

## 2015-04-17 DIAGNOSIS — J1189 Influenza due to unidentified influenza virus with other manifestations: Secondary | ICD-10-CM

## 2015-04-17 DIAGNOSIS — J111 Influenza due to unidentified influenza virus with other respiratory manifestations: Secondary | ICD-10-CM

## 2015-04-17 MED ORDER — OSELTAMIVIR PHOSPHATE 75 MG PO CAPS: 75.0000 mg | ORAL_CAPSULE | Freq: Two times a day (BID) | ORAL | Status: DC

## 2015-04-17 MED ORDER — HYDROCOD POLST-CPM POLST ER 10-8 MG/5ML PO SUER: 5.0000 mL | Freq: Two times a day (BID) | ORAL | Status: DC

## 2015-04-17 NOTE — Patient Instructions (Signed)

## 2015-04-17 NOTE — Progress Notes (Signed)
[facility] [address], [place] [address] [phone]  Date:  04/17/2015   Name:  William Farmer   DOB:  [birth year]   MRN:  [id number]  PCP:  No PCP Per Patient    Chief Complaint: Generalized Body Aches; Watery Eyes; Cough; Sore Throat; Fever; and Headache   History of Present Illness:  William Farmer is a 34 y.o. very pleasant male patient who presents with the following:  Ill since yesterday Has fever and chills Sudden onset malaise and fatigue Nasal congestion and clear nasal drainage Cough productive mucoid sputum No nausea or vomiting No wheezing or shortness of breath No improvement with over the counter medications or other home remedies.  Denies other complaint or health concern today.   Patient Active Problem List   Diagnosis Date Noted  . Testicular cancer 06/28/2012    Past Medical History  Diagnosis Date  . testicular ca dx'd 08/2011    surg only  . Allergy     Past Surgical History  Procedure Laterality Date  . Testicular cancer surgery      History  Substance Use Topics  . Smoking status: Former Research scientist (life sciences)  . Smokeless tobacco: Never Used  . Alcohol Use: No    Family History  Problem Relation Age of Onset  . Diabetes Father   . Diabetes Sister     Allergies  Allergen Reactions  . Iodine Hives    Denies respiratory distress but says he breaks out in hives  . Alprazolam     unknown  . Bee Venom Swelling  . Codeine Itching    Hives, twitching    Medication list has been reviewed and updated.  Current Outpatient Prescriptions on File Prior to Visit  Medication Sig Dispense Refill  . Ascorbic Acid (VITAMIN C) 100 MG tablet Take 100 mg by mouth daily.    . cyclobenzaprine (FLEXERIL) 10 MG tablet One half to one 3 times a day as needed as a muscle relaxant 30 tablet 0  . ibuprofen (ADVIL,MOTRIN) 600 MG tablet Take 600 mg by mouth every 6 (six) hours as needed for moderate pain.      No current  facility-administered medications on file prior to visit.    Review of Systems:  As per HPI, otherwise negative.    Physical Examination: Filed Vitals:   04/17/15 1753  Pulse: 111  Temp: 102.6 F (39.2 C)  Resp: 16   Filed Vitals:   04/17/15 1753  Height: 5\' 11"  (1.803 m)  Weight: 167 lb 2 oz (75.807 kg)   Body mass index is 23.32 kg/(m^2). Ideal Body Weight: Weight in (lb) to have BMI = 25: 178.9  GEN: WDWN, NAD, Non-toxic, A & O x 3 HEENT: Atraumatic, Normocephalic. Neck supple. No masses, No LAD. Ears and Nose: No external deformity. CV: RRR, No M/G/R. No JVD. No thrill. No extra heart sounds. PULM: CTA B, no wheezes, crackles, rhonchi. No retractions. No resp. distress. No accessory muscle use. ABD: S, NT, ND, +BS. No rebound. No HSM. EXTR: No c/c/e NEURO Normal gait.  PSYCH: Normally interactive. Conversant. Not depressed or anxious appearing.  Calm demeanor.    Assessment and Plan: Influenza tamiflu tussionex   Signed,  Ellison Carwin, MD

## 2016-06-25 ENCOUNTER — Other Ambulatory Visit: Payer: Self-pay | Admitting: Occupational Medicine

## 2016-06-25 ENCOUNTER — Ambulatory Visit: Payer: Self-pay

## 2016-06-25 DIAGNOSIS — M25571 Pain in right ankle and joints of right foot: Secondary | ICD-10-CM

## 2017-04-07 ENCOUNTER — Ambulatory Visit (INDEPENDENT_AMBULATORY_CARE_PROVIDER_SITE_OTHER): Payer: BLUE CROSS/BLUE SHIELD | Admitting: Physician Assistant

## 2017-04-07 ENCOUNTER — Encounter: Payer: Self-pay | Admitting: Physician Assistant

## 2017-04-07 VITALS — BP 124/75 | HR 60 | Temp 97.9°F | Resp 16 | Ht 71.0 in | Wt 170.6 lb

## 2017-04-07 DIAGNOSIS — J45909 Unspecified asthma, uncomplicated: Secondary | ICD-10-CM | POA: Diagnosis not present

## 2017-04-07 DIAGNOSIS — Z889 Allergy status to unspecified drugs, medicaments and biological substances status: Secondary | ICD-10-CM | POA: Diagnosis not present

## 2017-04-07 MED ORDER — OLOPATADINE HCL 0.2 % OP SOLN: 1.0000 [drp] | Freq: Every day | OPHTHALMIC | 0 refills | Status: DC | PRN

## 2017-04-07 MED ORDER — FLUTICASONE PROPIONATE 50 MCG/ACT NA SUSP: 2.0000 | Freq: Every day | NASAL | 12 refills | Status: DC

## 2017-04-07 MED ORDER — ALBUTEROL SULFATE HFA 108 (90 BASE) MCG/ACT IN AERS: 2.0000 | INHALATION_SPRAY | RESPIRATORY_TRACT | 1 refills | Status: DC | PRN

## 2017-04-07 MED ORDER — FEXOFENADINE HCL 180 MG PO TABS: 180.0000 mg | ORAL_TABLET | Freq: Every day | ORAL | 5 refills | Status: DC

## 2017-04-07 MED ORDER — PREDNISONE 20 MG PO TABS: ORAL_TABLET | ORAL | 0 refills | Status: DC

## 2017-04-07 NOTE — Progress Notes (Signed)
PRIMARY CARE AT [facility] [address] [place] [address] [phone]  Date:  04/07/2017   Name:  William Farmer   DOB:  December 13, 1981   MRN:  [id number]  PCP:  No PCP Per Patient    History of Present Illness:  William Farmer is a 36 y.o. male patient who presents to PCP with  Chief Complaint  Patient presents with  . sneezing    states he is having allergies issues  . Nasal Congestion    runny nose  . itchy throat    works outside a lot for his job  . hard to breathe  . Immunizations    discussed with patient about getting the tdap vaccine     --sneezing for 1 year.  Last week, he is sneezing, nasal congestion.  He woke up ths morning with watery eyes itchy eyes.  He uses eyedrops which helped.  Hoarseness.  No fever.  He has difficulty breathing by 1pm while he is in his truck.  He is from [facility] and has had difficulty with allergies, but this year worsened.  He has been taking zyrtec for about a year.  He has non-productive coughing.  Bleeding inside the nose when he places tissue inside nasal otherwise no epistaxis.  No facial pain.   No known environmental allergies. No hx of asthma.    Patient Active Problem List   Diagnosis Date Noted  . Testicular cancer ([place]) 06/28/2012    Past Medical History:  Diagnosis Date  . Allergy   . testicular ca dx'd 08/2011   surg only    Past Surgical History:  Procedure Laterality Date  . testicular cancer surgery      Social History  Substance Use Topics  . Smoking status: Former Research scientist (life sciences)  . Smokeless tobacco: Never Used  . Alcohol use No    Family History  Problem Relation Age of Onset  . Diabetes Father   . Diabetes Sister     Allergies  Allergen Reactions  . Iodine Hives    Denies respiratory distress but says he breaks out in hives  . Alprazolam     unknown  . Bee Venom Swelling  . Codeine Itching    Hives, twitching    Medication list has been reviewed and updated.  Current Outpatient Prescriptions on File  Prior to Visit  Medication Sig Dispense Refill  . Ascorbic Acid (VITAMIN C) 100 MG tablet Take 100 mg by mouth daily.    Marland Kitchen ibuprofen (ADVIL,MOTRIN) 600 MG tablet Take 600 mg by mouth every 6 (six) hours as needed for moderate pain.      No current facility-administered medications on file prior to visit.     ROS ROS otherwise unremarkable unless listed above.  Physical Examination: BP 124/75   Pulse 60   Temp 97.9 F (36.6 C) (Oral)   Resp 16   Ht 5\' 11"  (1.803 m)   Wt 170 lb 9.6 oz (77.4 kg)   SpO2 98%   BMI 23.79 kg/m  Ideal Body Weight: Weight in (lb) to have BMI = 25: 178.9  Physical Exam  Constitutional: He is oriented to person, place, and time. He appears well-developed and well-nourished. No distress.  HENT:  Head: Normocephalic and atraumatic.  Right Ear: Tympanic membrane, external ear and ear canal normal.  Left Ear: Tympanic membrane, external ear and ear canal normal.  Nose: Mucosal edema and rhinorrhea present. Right sinus exhibits no maxillary sinus tenderness and no frontal sinus tenderness. Left sinus  exhibits no maxillary sinus tenderness and no frontal sinus tenderness.  Mouth/Throat: No uvula swelling. No oropharyngeal exudate, posterior oropharyngeal edema or posterior oropharyngeal erythema.  Eyes: Conjunctivae, EOM and lids are normal. Pupils are equal, round, and reactive to light. Right eye exhibits normal extraocular motion. Left eye exhibits normal extraocular motion.  Inner lid without cobblestoning Very mild puffiness along both eyelids, where pt reports abnormal.  Neck: Trachea normal and full passive range of motion without pain. No edema and no erythema present.  Cardiovascular: Normal rate.   Pulmonary/Chest: Effort normal. No respiratory distress. He has no decreased breath sounds. He has no wheezes. He has no rhonchi.  Neurological: He is alert and oriented to person, place, and time.  Skin: Skin is warm and dry. He is not diaphoretic.   Psychiatric: He has a normal mood and affect. His behavior is normal.     Assessment and Plan: William Farmer is a 36 y.o. male who is here today for cc of nasal congestion, itchy eyes, wheezing. Exacerbated uncontrolled allergies.  Advised switching to allegra.  Given short prednisone taper.  He would like a referral to allergist for testing, and I agree with this.   Given albuterol and advised of use.   He will return if sxs do not improve, he will contact in 2 weeks.  May try singulair at that time Multiple allergies - Plan: fexofenadine (ALLEGRA) 180 MG tablet, Olopatadine HCl 0.2 % SOLN, predniSONE (DELTASONE) 20 MG tablet, Ambulatory referral to Allergy  Reactive airway disease without complication, unspecified asthma severity, unspecified whether persistent - Plan: albuterol (PROVENTIL HFA;VENTOLIN HFA) 108 (90 Base) MCG/ACT inhaler, Ambulatory referral to Allergy  Ivar Drape, PA-C [facility] 4/16/20187:20 AM

## 2017-04-07 NOTE — Patient Instructions (Addendum)
IF you received an x-ray today, you will receive an invoice from [facility] Rad[facility]. Please contact [facility] Radiology at [phone] with questions or concerns regarding your invoice.   IF you received labwork today, you will receive an invoice from [place]. Please contact [facility] at [phone] with questions or concerns regarding your invoice.   Our billing staff will not be able to assist you with questions regarding bills from these companies.  You will be contacted with the lab results as soon as they are available. The fastest way to get your results is to activate your My Chart account. Instructions are located on the last page of this paperwork. If you have not heard from [place] regarding the results in 2 weeks, please contact this office.    Please let me know if your allergies are not better controlled after 2 weeks.   You can contact me, and I will give the singulair.   Allergic Rhinitis Allergic rhinitis is when the mucous membranes in the nose respond to allergens. Allergens are particles in the air that cause your body to have an allergic reaction. This causes you to release allergic antibodies. Through a chain of events, these eventually cause you to release histamine into the blood stream. Although meant to protect the body, it is this release of histamine that causes your discomfort, such as frequent sneezing, congestion, and an itchy, runny nose. What are the causes? Seasonal allergic rhinitis (hay fever) is caused by pollen allergens that may come from grasses, trees, and weeds. Year-round allergic rhinitis (perennial allergic rhinitis) is caused by allergens such as house dust mites, pet dander, and mold spores. What are the signs or symptoms?  Nasal stuffiness (congestion).  Itchy, runny nose with sneezing and tearing of the eyes. How is this diagnosed? Your health care provider can help you determine the allergen or allergens that trigger your symptoms. If you  and your health care provider are unable to determine the allergen, skin or blood testing may be used. Your health care provider will diagnose your condition after taking your health history and performing a physical exam. Your health care provider may assess you for other related conditions, such as asthma, pink eye, or an ear infection. How is this treated? Allergic rhinitis does not have a cure, but it can be controlled by:  Medicines that block allergy symptoms. These may include allergy shots, nasal sprays, and oral antihistamines.  Avoiding the allergen. Hay fever may often be treated with antihistamines in pill or nasal spray forms. Antihistamines block the effects of histamine. There are over-the-counter medicines that may help with nasal congestion and swelling around the eyes. Check with your health care provider before taking or giving this medicine. If avoiding the allergen or the medicine prescribed do not work, there are many new medicines your health care provider can prescribe. Stronger medicine may be used if initial measures are ineffective. Desensitizing injections can be used if medicine and avoidance does not work. Desensitization is when a patient is given ongoing shots until the body becomes less sensitive to the allergen. Make sure you follow up with your health care provider if problems continue. Follow these instructions at home: It is not possible to completely avoid allergens, but you can reduce your symptoms by taking steps to limit your exposure to them. It helps to know exactly what you are allergic to so that you can avoid your specific triggers. Contact a health care provider if:  You have a fever.  You develop a cough that does not stop easily (persistent).  You have shortness of breath.  You start wheezing.  Symptoms interfere with normal daily activities. This information is not intended to replace advice given to you by your health care provider. Make sure  you discuss any questions you have with your health care provider. Document Released: 09/08/2001 Document Revised: 08/14/2016 Document Reviewed: 08/21/2013 Elsevier Interactive Patient Education  2017 [employer].

## 2017-05-19 ENCOUNTER — Encounter: Payer: Self-pay | Admitting: Physician Assistant

## 2017-05-19 DIAGNOSIS — J4520 Mild intermittent asthma, uncomplicated: Secondary | ICD-10-CM | POA: Insufficient documentation

## 2017-05-19 DIAGNOSIS — J301 Allergic rhinitis due to pollen: Secondary | ICD-10-CM | POA: Insufficient documentation

## 2017-05-19 DIAGNOSIS — Z91030 Bee allergy status: Secondary | ICD-10-CM | POA: Insufficient documentation

## 2017-05-19 DIAGNOSIS — Z91013 Allergy to seafood: Secondary | ICD-10-CM | POA: Insufficient documentation

## 2017-06-02 ENCOUNTER — Encounter: Payer: Self-pay | Admitting: Physician Assistant

## 2017-06-02 ENCOUNTER — Ambulatory Visit (INDEPENDENT_AMBULATORY_CARE_PROVIDER_SITE_OTHER): Payer: BLUE CROSS/BLUE SHIELD | Admitting: Physician Assistant

## 2017-06-02 VITALS — BP 127/88 | HR 81 | Temp 98.6°F | Resp 18 | Ht 72.0 in | Wt 170.0 lb

## 2017-06-02 DIAGNOSIS — J22 Unspecified acute lower respiratory infection: Secondary | ICD-10-CM | POA: Diagnosis not present

## 2017-06-02 MED ORDER — GUAIFENESIN ER 1200 MG PO TB12: 1.0000 | ORAL_TABLET | Freq: Two times a day (BID) | ORAL | 1 refills | Status: DC | PRN

## 2017-06-02 MED ORDER — AZITHROMYCIN 250 MG PO TABS: ORAL_TABLET | ORAL | 0 refills | Status: DC

## 2017-06-02 NOTE — Patient Instructions (Addendum)
Please take the antibiotic as prescribed. I would like you to use the albuterol every 6 hours routinely for the next 24 hours.   Continue the mucinex. Make sure that you are hydrating well with over 64 oz per day    IF you received an x-ray today, you will receive an invoice from [facility] Radiology. Please contact [facility] Radiology at [phone] with questions or concerns regarding your invoice.   IF you received labwork today, you will receive an invoice from [place]. Please contact [facility] at [phone] with questions or concerns regarding your invoice.   Our billing staff will not be able to assist you with questions regarding bills from these companies.  You will be contacted with the lab results as soon as they are available. The fastest way to get your results is to activate your My Chart account. Instructions are located on the last page of this paperwork. If you have not heard from [place] regarding the results in 2 weeks, please contact this office.

## 2017-06-02 NOTE — Progress Notes (Signed)
PRIMARY CARE AT [facility] [address], [place] [address] [phone]  Date:  06/02/2017   Name:  William Farmer   DOB:  1981/03/15   MRN:  [id number]  PCP:  Joretta Bachelor, PA    History of Present Illness:  William Farmer is a 36 y.o. male patient who presents to PCP with  Chief Complaint  Patient presents with  . Flu like symptoms    x 5 days, fever, body ache, sore throat, headache, cough, fatigue     Woke up with difficulty with breathign due to heavy coughing.  Throat pain.  2 days ago, she was having fatigue with normal activity.  Bodyaches.  He has headache at the back of head.  No vision changes.  No nausea.  Cough is productive of yellow sputum.  He has nasal congested which is yellow.  Cough is not worse at night.   He has been needing to use his inhaler more.   Severe tylenol.  He took mucinex.     Patient Active Problem List   Diagnosis Date Noted  . Mild intermittent asthma 05/19/2017  . 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 Allergic rhinitis due to pollen 05/19/2017  . Testicular cancer ([place]) 06/28/2012    Past Medical History:  Diagnosis Date  . Allergy   . testicular ca dx'd 08/2011   surg only    Past Surgical History:  Procedure Laterality Date  . testicular cancer surgery      Social History  Substance Use Topics  . Smoking status: Former Research scientist (life sciences)  . Smokeless tobacco: Never Used  . Alcohol use No    Family History  Problem Relation Age of Onset  . Diabetes Father   . Diabetes Sister     Allergies  Allergen Reactions  . Pollen Extract Shortness Of Breath  . Iodine Hives    Denies respiratory distress but says he breaks out in hives  . Alprazolam     unknown  . Bee Venom Swelling  . Codeine Itching    Hives, twitching  . Shrimp [Shellfish Allergy] Other (See Comments)    Itchy throat    Medication list has been reviewed and updated.  Current Outpatient Prescriptions on File Prior to Visit   Medication Sig Dispense Refill  . albuterol (PROVENTIL HFA;VENTOLIN HFA) 108 (90 Base) MCG/ACT inhaler Inhale 2 puffs into the lungs every 4 (four) hours as needed for wheezing or shortness of breath (cough, shortness of breath or wheezing.). 1 Inhaler 1  . Ascorbic Acid (VITAMIN C) 100 MG tablet Take 100 mg by mouth daily.    . fluticasone (FLONASE) 50 MCG/ACT nasal spray Place 2 sprays into both nostrils daily. 16 g 12  . ibuprofen (ADVIL,MOTRIN) 600 MG tablet Take 600 mg by mouth every 6 (six) hours as needed for moderate pain.     Marland Kitchen Olopatadine HCl 0.2 % SOLN Apply 1 drop to eye daily as needed. 2.5 mL 0  . cetirizine (ZYRTEC) 10 MG tablet Take 10 mg by mouth daily.    . fexofenadine (ALLEGRA) 180 MG tablet Take 1 tablet (180 mg total) by mouth daily. (Patient not taking: Reported on 06/02/2017) 30 tablet 5   No current facility-administered medications on file prior to visit.     ROS ROS otherwise unremarkable unless listed above.  Physical Examination: BP 127/88 (BP Location: Right Arm, Patient Position: Sitting, Cuff Size: Normal)   Pulse 81   Temp 98.6 F (37  C) (Oral)   Resp 18   Ht 6' (1.829 m)   Wt 170 lb (77.1 kg)   SpO2 96%   BMI 23.06 kg/m  Ideal Body Weight: Weight in (lb) to have BMI = 25: 183.9  Physical Exam  Constitutional: He is oriented to person, place, and time. He appears well-developed and well-nourished. No distress.  HENT:  Head: Normocephalic and atraumatic.  Eyes: Conjunctivae and EOM are normal. Pupils are equal, round, and reactive to light.  Cardiovascular: Normal rate and regular rhythm.  Exam reveals no friction rub.   No murmur heard. Pulmonary/Chest: Effort normal. No respiratory distress.  Neurological: He is alert and oriented to person, place, and time.  Skin: Skin is warm and dry. He is not diaphoretic.  Psychiatric: He has a normal mood and affect. His behavior is normal.     Assessment and Plan: William Farmer is a 36 y.o. male who  is here today for  Chief Complaint  Patient presents with  . Flu like symptoms    x 5 days, fever, body ache, sore throat, headache, cough, fatigue  Lower respiratory infection (e.g., bronchitis, pneumonia, pneumonitis, pulmonitis) - Plan: azithromycin (ZITHROMAX) 250 MG tablet, Guaifenesin (MUCINEX MAXIMUM STRENGTH) 1200 MG TB12  Ivar Drape, PA-C [facility] 6/8/20189:44 PM

## 2017-07-05 ENCOUNTER — Encounter: Payer: Self-pay | Admitting: Physician Assistant

## 2017-07-05 ENCOUNTER — Ambulatory Visit (INDEPENDENT_AMBULATORY_CARE_PROVIDER_SITE_OTHER): Payer: BLUE CROSS/BLUE SHIELD

## 2017-07-05 ENCOUNTER — Ambulatory Visit (INDEPENDENT_AMBULATORY_CARE_PROVIDER_SITE_OTHER): Payer: BLUE CROSS/BLUE SHIELD | Admitting: Physician Assistant

## 2017-07-05 VITALS — BP 112/72 | HR 71 | Temp 97.6°F | Resp 18 | Ht 72.0 in | Wt 171.0 lb

## 2017-07-05 DIAGNOSIS — M25552 Pain in left hip: Secondary | ICD-10-CM

## 2017-07-05 MED ORDER — MELOXICAM 15 MG PO TABS: 15.0000 mg | ORAL_TABLET | Freq: Every day | ORAL | 0 refills | Status: DC

## 2017-07-05 NOTE — Patient Instructions (Addendum)
Please ice the hip three times per week for 15 minutes.   Please perform stretches three times per day. Ice directly afterwards. If the symptoms continue, I would like to know.  You will then need to see the orthopedist. Do not take ibuprofen or naproxen with the mobic.  You can take tylenol.   Hip Exercises Ask your health care provider which exercises are safe for you. Do exercises exactly as told by your health care provider and adjust them as directed. It is normal to feel mild stretching, pulling, tightness, or discomfort as you do these exercises, but you should stop right away if you feel sudden pain or your pain gets worse.Do not begin these exercises until told by your health care provider. STRETCHING AND RANGE OF MOTION EXERCISES These exercises warm up your muscles and joints and improve the movement and flexibility of your hip. These exercises also help to relieve pain, numbness, and tingling. Exercise A: Hamstrings, Supine  1. Lie on your back. 2. Loop a belt or towel over the ball of your left / rightfoot. The ball of your foot is on the walking surface, right under your toes. 3. Straighten your left / rightknee and slowly pull on the belt to raise your leg. ? Do not let your left / right knee bend while you do this. ? Keep your other leg flat on the floor. ? Raise the left / right leg until you feel a gentle stretch behind your left / right knee or thigh. 4. Hold this position for ____10______ seconds. 5. Slowly return your leg to the starting position. Repeat ___3_______ times. Complete this stretch ____3______ times a day. Exercise B: Hip Rotators  1. Lie on your back on a firm surface. 2. Hold your left / right knee with your left / right hand. Hold your ankle with your other hand. 3. Gently pull your left / right knee and rotate your lower leg toward your other shoulder. ? Pull until you feel a stretch in your buttocks. ? Keep your hips and shoulders firmly planted  while you do this stretch. 4. Hold this position for _____10_____ seconds. Repeat ____3______ times. Complete this stretch ______3____ times a day. Exercise C: V-Sit (Hamstrings and Adductors)  1. Sit on the floor with your legs extended in a large "V" shape. Keep your knees straight during this exercise. 2. Start with your head and chest upright, then bend at your waist to reach for your left foot (position A). You should feel a stretch in your right inner thigh. 3. Hold this position for ____10______ seconds. Then slowly return to the upright position. 4. Bend at your waist to reach forward (position B). You should feel a stretch behind both of your thighs and knees. 5. Hold this position for ____10______ seconds. Then slowly return to the upright position. 6. Bend at your waist to reach for your right foot (position C). You should feel a stretch in your left inner thigh. 7. Hold this position for ___10_______ seconds. Then slowly return to the upright position. Repeat _______3___ times. Complete this stretch _____3_____ times a day. Exercise D: Lunge (Hip Flexors)  1. Place your left / right knee on the floor and bend your other knee so that is directly over your ankle. You should be half-kneeling. 2. Keep good posture with your head over your shoulders. 3. Tighten your buttocks to point your tailbone downward. This helps your back to keep from arching too much. 4. You should feel a gentle  stretch in the front of your left / right thigh and hip. If you do not feel any resistance, slightly slide your other foot forward and then slowly lunge forward so your knee once again lines up over your ankle. 5. Make sure your tailbone continues to point downward. 6. Hold this position for __10________ seconds. Repeat __3________ times. Complete this stretch ____3______ times a day. STRENGTHENING EXERCISES These exercises build strength and endurance in your hip. Endurance is the ability to use your  muscles for a long time, even after they get tired. Exercise E: Bridge (Hip Extensors)  1. Lie on your back on a firm surface with your knees bent and your feet flat on the floor. 2. Tighten your buttocks muscles and lift your bottom off the floor until the trunk of your body is level with your thighs. ? Do not arch your back. ? You should feel the muscles working in your buttocks and the back of your thighs. If you do not feel these muscles, slide your feet 1-2 inches (2.5-5 cm) farther away from your buttocks. 3. Hold this position for ____10______ seconds. 4. Slowly lower your hips to the starting position. 5. Let your muscles relax completely between repetitions. 6. If this exercise is too easy, try doing it with your arms crossed over your chest. Repeat ___3_______ times. Complete this exercise ___3_______ times a day. Exercise F: Straight Leg Raises - Hip Abductors  1. Lie on your side with your left / right leg in the top position. Lie so your head, shoulder, knee, and hip line up with each other. You may bend your bottom knee to help you balance. 2. Roll your hips slightly forward, so your hips are stacked directly over each other and your left / right knee is facing forward. 3. Leading with your heel, lift your top leg 4-6 inches (10-15 cm). You should feel the muscles in your outer hip lifting. ? Do not let your foot drift forward. ? Do not let your knee roll toward the ceiling. 4. Hold this position for ___10_______ seconds. 5. Slowly return to the starting position. 6. Let your muscles relax completely between repetitions. Repeat ___3_______ times. Complete this exercise ______3____ times a day. Exercise G: Straight Leg Raises - Hip Adductors  1. Lie on your side with your left / right leg in the bottom position. Lie so your head, shoulder, knee, and hip line up. You may place your upper foot in front to help you balance. 2. Roll your hips slightly forward, so your hips are  stacked directly over each other and your left / right knee is facing forward. 3. Tense the muscles in your inner thigh and lift your bottom leg 4-6 inches (10-15 cm). 4. Hold this position for ___10_______ seconds. 5. Slowly return to the starting position. 6. Let your muscles relax completely between repetitions. Repeat _____3_____ times. Complete this exercise _____3_____ times a day. Exercise H: Straight Leg Raises - Quadriceps  1. Lie on your back with your left / right leg extended and your other knee bent. 2. Tense the muscles in the front of your left / right thigh. When you do this, you should see your kneecap slide up or see increased dimpling just above your knee. 3. Tighten these muscles even more and raise your leg 4-6 inches (10-15 cm) off the floor. 4. Hold this position for ___10_______ seconds. 5. Keep these muscles tense as you lower your leg. 6. Relax the muscles slowly and completely between repetitions.  Repeat ___3_______ times. Complete this exercise _____3_____ times a day. Exercise I: Hip Abductors, Standing 1. Tie one end of a rubber exercise band or tubing to a secure surface, such as a table or pole. 2. Loop the other end of the band or tubing around your left / right ankle. 3. Keeping your ankle with the band or tubing directly opposite of the secured end, step away until there is tension in the tubing or band. Hold onto a chair as needed for balance. 4. Lift your left / right leg out to your side. While you do this: ? Keep your back upright. ? Keep your shoulders over your hips. ? Keep your toes pointing forward. ? Make sure to use your hip muscles to lift your leg. Do not "throw" your leg or tip your body to lift your leg. 5. Hold this position for _____10_____ seconds. 6. Slowly return to the starting position. Repeat ____3______ times. Complete this exercise ____3______ times a day. Exercise J: Squats (Quadriceps) 1. Stand in a door frame so your feet and  knees are in line with the frame. You may place your hands on the frame for balance. 2. Slowly bend your knees and lower your hips like you are going to sit in a chair. ? Keep your lower legs in a straight-up-and-down position. ? Do not let your hips go lower than your knees. ? Do not bend your knees lower than told by your health care provider. ? If your hip pain increases, do not bend as low. 3. Hold this position for ____10_______ seconds. 4. Slowly push with your legs to return to standing. Do not use your hands to pull yourself to standing. Repeat ____3______ times. Complete this exercise ____3______ times a day. This information is not intended to replace advice given to you by your health care provider. Make sure you discuss any questions you have with your health care provider. Document Released: 01/01/2006 Document Revised: 09/07/2016 Document Reviewed: 12/09/2015 Elsevier Interactive Patient Education  2018 [employer].    IF you received an x-ray today, you will receive an invoice from [facility]. Please contact [facility] Radiology at [phone] with questions or concerns regarding your invoice.   IF you received labwork today, you will receive an invoice from [place]. Please contact [facility] at [phone] with questions or concerns regarding your invoice.   Our billing staff will not be able to assist you with questions regarding bills from these companies.  You will be contacted with the lab results as soon as they are available. The fastest way to get your results is to activate your My Chart account. Instructions are located on the last page of this paperwork. If you have not heard from [place] regarding the results in 2 weeks, please contact this office.

## 2017-07-05 NOTE — Progress Notes (Signed)
PRIMARY CARE AT [facility] [address] [place] [address] [phone]  Date:  07/05/2017   Name:  William Farmer   DOB:  1981-01-18   MRN:  [id number]  PCP:  Joretta Bachelor, PA    History of Present Illness:  William Farmer is a 36 y.o. male patient who presents to PCP with  Chief Complaint  Patient presents with  . Hip Pain    left hip has been a long time.... having trouble getting into and out of car   . Back Pain     Patient reports 4 months of hip pain.  Hard to lift the left leg due to the pain is at the left side.  Radiates along the side of thigh.  No numbness or tingling.  No swelling that he has noticed.  Aggravated with lifting the leg while standing, sitting to put on shoe, and bending down is significant pain.  He is currently not exercising.  No hx of trauma, recent or past.   No    Patient Active Problem List   Diagnosis Date Noted  . Mild intermittent asthma 05/19/2017  . 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 Allergic rhinitis due to pollen 05/19/2017  . Testicular cancer ([place]) 06/28/2012    Past Medical History:  Diagnosis Date  . Allergy   . testicular ca dx'd 08/2011   surg only    Past Surgical History:  Procedure Laterality Date  . testicular cancer surgery      Social History  Substance Use Topics  . Smoking status: Former Research scientist (life sciences)  . Smokeless tobacco: Never Used  . Alcohol use No    Family History  Problem Relation Age of Onset  . Diabetes Father   . Diabetes Sister     Allergies  Allergen Reactions  . Pollen Extract Shortness Of Breath  . Iodine Hives    Denies respiratory distress but says he breaks out in hives  . Alprazolam     unknown  . Bee Venom Swelling  . Codeine Itching    Hives, twitching  . Shrimp [Shellfish Allergy] Other (See Comments)    Itchy throat    Medication list has been reviewed and updated.  Current Outpatient Prescriptions on File Prior to Visit  Medication Sig  Dispense Refill  . albuterol (PROVENTIL HFA;VENTOLIN HFA) 108 (90 Base) MCG/ACT inhaler Inhale 2 puffs into the lungs every 4 (four) hours as needed for wheezing or shortness of breath (cough, shortness of breath or wheezing.). 1 Inhaler 1  . Ascorbic Acid (VITAMIN C) 100 MG tablet Take 100 mg by mouth daily.    Marland Kitchen azithromycin (ZITHROMAX) 250 MG tablet Take 2 tabs PO x 1 dose, then 1 tab PO QD x 4 days 6 tablet 0  . Guaifenesin (MUCINEX MAXIMUM STRENGTH) 1200 MG TB12 Take 1 tablet (1,200 mg total) by mouth every 12 (twelve) hours as needed. 14 tablet 1  . ibuprofen (ADVIL,MOTRIN) 600 MG tablet Take 600 mg by mouth every 6 (six) hours as needed for moderate pain.     Marland Kitchen Olopatadine HCl 0.2 % SOLN Apply 1 drop to eye daily as needed. 2.5 mL 0  . fluticasone (FLONASE) 50 MCG/ACT nasal spray Place 2 sprays into both nostrils daily. (Patient not taking: Reported on 07/05/2017) 16 g 12   No current facility-administered medications on file prior to visit.     ROS ROS otherwise unremarkable unless listed above.  Physical Examination: BP 112/72  Pulse 71   Temp 97.6 F (36.4 C) (Oral)   Resp 18   Ht 6' (1.829 m)   Wt 171 lb (77.6 kg)   SpO2 99%   BMI 23.19 kg/m  Ideal Body Weight: Weight in (lb) to have BMI = 25: 183.9  Physical Exam  Constitutional: He is oriented to person, place, and time. He appears well-developed and well-nourished. No distress.  HENT:  Head: Normocephalic and atraumatic.  Eyes: Conjunctivae and EOM are normal. Pupils are equal, round, and reactive to light.  Cardiovascular: Normal rate.   Pulmonary/Chest: Effort normal. No respiratory distress.  Musculoskeletal:  Pain along the greater trochanter without noticeable swelling or warmth to the area.  Passive rom, pain incited with external rotation, none internal.  Normal strength.    Neurological: He is alert and oriented to person, place, and time.  Skin: Skin is warm and dry. He is not diaphoretic.  Psychiatric: He  has a normal mood and affect. His behavior is normal.     Assessment and Plan: William Farmer is a 36 y.o. male who is here today for hip pain Possible bursitis,  Icing regimen. Patient will return if his symptoms are not improved within 2 weeks.  Advised nsaid precaution. 1. Pain of left hip joint - DG HIP UNILAT W OR W/O PELVIS 2-3 VIEWS LEFT; Future - meloxicam (MOBIC) 15 MG tablet; Take 1 tablet (15 mg total) by mouth daily.  Dispense: 30 tablet; Refill: 0   Ivar Drape, PA-C [facility] 07/05/2017 4:50 PM

## 2017-08-03 ENCOUNTER — Encounter (HOSPITAL_COMMUNITY): Payer: Self-pay | Admitting: Emergency Medicine

## 2017-08-03 ENCOUNTER — Emergency Department (HOSPITAL_COMMUNITY)
Admission: EM | Admit: 2017-08-03 | Discharge: 2017-08-03 | Disposition: A | Discharge status: Home / Self Care | Payer: BLUE CROSS/BLUE SHIELD | Attending: Physician Assistant | Admitting: Physician Assistant

## 2017-08-03 DIAGNOSIS — Z8547 Personal history of malignant neoplasm of testis: Secondary | ICD-10-CM | POA: Diagnosis not present

## 2017-08-03 DIAGNOSIS — J45909 Unspecified asthma, uncomplicated: Secondary | ICD-10-CM | POA: Diagnosis not present

## 2017-08-03 DIAGNOSIS — M546 Pain in thoracic spine: Secondary | ICD-10-CM | POA: Insufficient documentation

## 2017-08-03 DIAGNOSIS — Z79899 Other long term (current) drug therapy: Secondary | ICD-10-CM | POA: Diagnosis not present

## 2017-08-03 DIAGNOSIS — Z87891 Personal history of nicotine dependence: Secondary | ICD-10-CM | POA: Diagnosis not present

## 2017-08-03 MED ORDER — NAPROXEN 500 MG PO TABS: 500.0000 mg | ORAL_TABLET | Freq: Two times a day (BID) | ORAL | 0 refills | Status: DC

## 2017-08-03 MED ORDER — METHOCARBAMOL 500 MG PO TABS: 500.0000 mg | ORAL_TABLET | Freq: Every evening | ORAL | 0 refills | Status: DC | PRN

## 2017-08-03 NOTE — ED Provider Notes (Signed)
[facility] DEPT Provider Note   CSN: [id number] Arrival date & time: 08/03/17  1842  By signing my name below, I, William Farmer, attest that this documentation has been prepared under the direction and in the presence of Janetta Hora, PA-C.  Electronically Signed: Reola Farmer, ED Scribe. 08/03/17. 8:36 PM.  History   Chief Complaint Chief Complaint  Patient presents with  . Back Pain   The history is provided by the patient. No language interpreter was used.    HPI Comments: William Farmer is a 36 y.o. male w/ a h/o testicular cancer, who presents to the Emergency Department complaining of persistent mid back pain beginning earlier today. Pt reports that he was lifting two 60+lbs boxes today when he had an acute onset of mid back pain. No recent trauma to the back. He describes his pain as spasmodic in nature and he reports that his pain radiates into his right lateral thoracic region. His pain is worse with generalized torso movements. He denies gait abnormality, bowel/bladder incontinence, saddle anaesthesia/paraesthesias, weakness, numbness, fever, or any other associated symptoms.   Past Medical History:  Diagnosis Date  . Allergy   . testicular ca dx'd 08/2011   surg only   Patient Active Problem List   Diagnosis Date Noted  . Mild intermittent asthma 05/19/2017  . 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 Allergic rhinitis due to pollen 05/19/2017  . Testicular cancer ([place]) 06/28/2012    Past Surgical History:  Procedure Laterality Date  . testicular cancer surgery      Home Medications    Prior to Admission medications   Medication Sig Start Date End Date Taking? Authorizing Provider  albuterol (PROVENTIL HFA;VENTOLIN HFA) 108 (90 Base) MCG/ACT inhaler Inhale 2 puffs into the lungs every 4 (four) hours as needed for wheezing or shortness of breath (cough, shortness of breath or wheezing.). 04/07/17   Ivar Drape D, PA    Ascorbic Acid (VITAMIN C) 100 MG tablet Take 100 mg by mouth daily.    [provider]  azithromycin (ZITHROMAX) 250 MG tablet Take 2 tabs PO x 1 dose, then 1 tab PO QD x 4 days 06/02/17   English, Stephanie D, PA  fluticasone (FLONASE) 50 MCG/ACT nasal spray Place 2 sprays into both nostrils daily. Patient not taking: Reported on 07/05/2017 04/07/17   Ivar Drape D, PA  Guaifenesin [facility] MAXIMUM STRENGTH) 1200 MG TB12 Take 1 tablet (1,200 mg total) by mouth every 12 (twelve) hours as needed. 06/02/17   Ivar Drape D, PA  ibuprofen (ADVIL,MOTRIN) 600 MG tablet Take 600 mg by mouth every 6 (six) hours as needed for moderate pain.  04/11/12   [provider]  meloxicam (MOBIC) 15 MG tablet Take 1 tablet (15 mg total) by mouth daily. 07/05/17   Ivar Drape D, PA  Olopatadine HCl 0.2 % SOLN Apply 1 drop to eye daily as needed. 04/07/17   Joretta Bachelor, PA   Family History Family History  Problem Relation Age of Onset  . Diabetes Father   . Diabetes Sister    Social History Social History  Substance Use Topics  . Smoking status: Former Research scientist (life sciences)  . Smokeless tobacco: Never Used  . Alcohol use No   Allergies   Pollen extract; Iodine; Alprazolam; Bee venom; Codeine; and Shrimp [shellfish allergy]  Review of Systems Review of Systems  Constitutional: Negative for fever.  Musculoskeletal: Positive for back pain.  Neurological: Negative for weakness and numbness.  Physical Exam Updated Vital Signs BP (!) 142/87 (BP Location: Right Arm)   Pulse (!) 103   Temp 98 F (36.7 C) (Oral)   Resp 16   SpO2 99%   Physical Exam  Constitutional: He is oriented to person, place, and time. He appears well-developed and well-nourished. No distress.  HENT:  Head: Normocephalic and atraumatic.  Eyes: Pupils are equal, round, and reactive to light. Conjunctivae are normal. Right eye exhibits no discharge. Left eye exhibits no discharge. No scleral icterus.  Neck:  Normal range of motion.  Cardiovascular: Normal rate.   Pulmonary/Chest: Effort normal. No respiratory distress.  Abdominal: He exhibits no distension.  Musculoskeletal: Normal range of motion.  Back: Inspection: No masses, deformity, or rash Palpation: No midline spinal tenderness. Right mid thoracic back tenderness. Strength: 5/5 in lower extremities and normal plantar and dorsiflexion Sensation: Intact sensation with light touch in lower extremities bilaterally SLR: Negative seated straight leg raise Gait: Normal gait  Neurological: He is alert and oriented to person, place, and time.  Skin: Skin is warm and dry. No pallor.  Psychiatric: He has a normal mood and affect. His behavior is normal.  Nursing note and vitals reviewed.  ED Treatments / Results  DIAGNOSTIC STUDIES: Oxygen Saturation is 99% on RA, normal by my interpretation.   COORDINATION OF CARE: 8:35 PM-Discussed next steps with pt. Pt verbalized understanding and is agreeable with the plan.   Labs (all labs ordered are listed, but only abnormal results are displayed) Labs Reviewed - No data to display  EKG  EKG Interpretation None      Radiology No results found.  Procedures Procedures   Medications Ordered in ED Medications - No data to display  Initial Impression / Assessment and Plan / ED Course  I have reviewed the triage vital signs and the nursing notes.  Pertinent labs & imaging results that were available during my care of the patient were reviewed by me and considered in my medical decision making (see chart for details).  Patient is a 36 y.o. male who presents to the ED with back pain. No neurological deficits appreciated. Patient is ambulatory. No warning symptoms of back pain including: fecal incontinence, urinary retention or overflow incontinence, night sweats, waking from sleep with back pain, unexplained fevers, h/o cancer, recent trauma. No concern for cauda equina, epidural abscess,  or other serious cause of back pain. Conservative measures such as rest, ice/heat and pain medicine indicated with PCP follow-up if no improvement with conservative management.   Final Clinical Impressions(s) / ED Diagnoses   Final diagnoses:  Acute right-sided thoracic back pain   New Prescriptions New Prescriptions   No medications on file   I personally performed the services described in this documentation, which was scribed in my presence. The recorded information has been reviewed and is accurate.     Recardo Evangelist, PA-C 08/03/17 2349    Macarthur Critchley, MD 08/03/17 [phone]

## 2017-08-03 NOTE — ED Triage Notes (Signed)
Pt states he is having mid back pain that feels like a muscle spasms. Pt states today  lifting up a 60+ pound box and after started having lower back pain.

## 2017-08-03 NOTE — Discharge Instructions (Signed)
Take Naproxen twice daily for the next week. Take this medicine with food. Take muscle relaxer at bedtime to help you sleep. This medicine makes you drowsy so do not take before driving or work Use a heating pad for sore muscles - use for 20 minutes several times a day Return for worsening symptoms

## 2017-08-04 ENCOUNTER — Encounter: Payer: Self-pay | Admitting: Physician Assistant

## 2017-08-04 ENCOUNTER — Ambulatory Visit (INDEPENDENT_AMBULATORY_CARE_PROVIDER_SITE_OTHER): Payer: BLUE CROSS/BLUE SHIELD | Admitting: Physician Assistant

## 2017-08-04 VITALS — BP 126/73 | HR 56 | Temp 98.4°F | Resp 18 | Ht 72.0 in | Wt 165.2 lb

## 2017-08-04 DIAGNOSIS — M545 Low back pain, unspecified: Secondary | ICD-10-CM

## 2017-08-04 DIAGNOSIS — M62838 Other muscle spasm: Secondary | ICD-10-CM

## 2017-08-04 MED ORDER — CYCLOBENZAPRINE HCL 5 MG PO TABS: 5.0000 mg | ORAL_TABLET | Freq: Three times a day (TID) | ORAL | 0 refills | Status: DC | PRN

## 2017-08-04 NOTE — Patient Instructions (Addendum)
I recommend resting today. However, tomorrow I would begin walking and moving around as much as tolerated. Begin stretching in a couple of days. The worse thing you can do for low back pain is lie in bed all day or sit down all day. Use medications as needed. I recommend discontinuing robaxin today and starting flexeril and continuing naproxen daily.   Just to know, flexeril can cause side effects that may impair your thinking or reactions. Be careful if you drive or do anything that requires you to be awake and alert. void drinking alcohol, which can increase some of the side effects of Flexeril.  NSAIDs like naproxen have common side effects of heartburn, stomach pain, indigestion, and headache. Could lead to renal insufficiency, stroke, or GI bleed if taken excess amounts outside of what is recommended on label long term.    You should avoid heavy lifting or strenuous repetitive activity to prevent recurrence of event. Experiment with both ice and heat and choose whichever feels best for you.  Use heat pad, do not apply directly to skin, use barrier such as towel over the skin. Leave on for 15-20 minutes, 3-4 times a day. You can put rice in an old sock and put it in the microwave for 30 seconds to make a heating pad.   Please perform exercises below. Stretches are to be performed for 2 sets, holding 10-15 seconds each. Recommended to perform this rehab twice daily within pain tolerance for 2 weeks.   FLEXION RANGE OF MOTION AND STRETCHING EXERCISES: STRETCH - Flexion, Single Knee to Chest   Lie on a firm bed or floor with both legs extended in front of you.  Keeping one leg in contact with the floor, bring your opposite knee to your chest. Hold your leg in place by either grabbing behind your thigh or at your knee.  Pull until you feel a gentle stretch in your lower back.   Slowly release your grasp and repeat the exercise with the opposite side.  STRETCH - Flexion, Double Knee to Chest    Lie on a firm bed or floor with both legs extended in front of you.  Keeping one leg in contact with the floor, bring your opposite knee to your chest.  Tense your stomach muscles to support your back and then lift your other knee to your chest. Hold your legs in place by either grabbing behind your thighs or at your knees.  Pull both knees toward your chest until you feel a gentle stretch in your lower back.   Tense your stomach muscles and slowly return one leg at a time to the floor.  STRETCH - Low Trunk Rotation  Lie on a firm bed or floor. Keeping your legs in front of you, bend your knees so they are both pointed toward the ceiling and your feet are flat on the floor.  Extend your arms out to the side. This will stabilize your upper body by keeping your shoulders in contact with the floor.  Gently and slowly drop both knees together to one side until you feel a gentle stretch in your lower back.   Tense your stomach muscles to support your lower back as you bring your knees back to the starting position. Repeat the exercise to the other side.   EXTENSION RANGE OF MOTION AND FLEXIBILITY EXERCISES: STRETCH - Extension, Prone on Elbows   Lie on your stomach on the floor, a bed will be too soft. Place your palms about shoulder  width apart and at the height of your head.  Place your elbows under your shoulders. If this is too painful, stack pillows under your chest.  Allow your body to relax so that your hips drop lower and make contact more completely with the floor.  Slowly return to lying flat on the floor.  RANGE OF MOTION - Extension, Prone Press Ups  Lie on your stomach on the floor, a bed will be too soft. Place your palms about shoulder width apart and at the height of your head.  Keeping your back as relaxed as possible, slowly straighten your elbows while keeping your hips on the floor. You may adjust the placement of your hands to maximize your comfort. As you gain  motion, your hands will come more underneath your shoulders.  Slowly return to lying flat on the floor.  RANGE OF MOTION- Quadruped, Neutral Spine   Assume a hands and knees position on a firm surface. Keep your hands under your shoulders and your knees under your hips. You may place padding under your knees for comfort.  Drop your head and point your tail bone toward the ground below you. This will round out your lower back like an angry cat.    Slowly lift your head and release your tail bone so that your back sags into a large arch, like an old horse.  Repeat this until you feel limber in your lower back.  Now, find your "sweet spot." This will be the most comfortable position somewhere between the two previous positions. This is your neutral spine. Once you have found this position, tense your stomach muscles to support your lower back.  STRENGTHENING EXERCISES - Low Back Strain These exercises may help you when beginning to rehabilitate your injury. These exercises should be done near your "sweet spot." This is the neutral, low-back arch, somewhere between fully rounded and fully arched, that is your least painful position. When performed in this safe range of motion, these exercises can be used for people who have either a flexion or extension based injury. These exercises may resolve your symptoms with or without further involvement from your physician, physical therapist or athletic trainer. While completing these exercises, remember:   Muscles can gain both the endurance and the strength needed for everyday activities through controlled exercises.  Complete these exercises as instructed by your physician, physical therapist or athletic trainer. Increase the resistance and repetitions only as guided.  You may experience muscle soreness or fatigue, but the pain or discomfort you are trying to eliminate should never worsen during these exercises. If this pain does worsen, stop and make  certain you are following the directions exactly. If the pain is still present after adjustments, discontinue the exercise until you can discuss the trouble with your caregiver.  STRENGTHENING - Deep Abdominals, Pelvic Tilt  Lie on a firm bed or floor. Keeping your legs in front of you, bend your knees so they are both pointed toward the ceiling and your feet are flat on the floor.  Tense your lower abdominal muscles to press your lower back into the floor. This motion will rotate your pelvis so that your tail bone is scooping upwards rather than pointing at your feet or into the floor.  STRENGTHENING - Abdominals, Crunches   Lie on a firm bed or floor. Keeping your legs in front of you, bend your knees so they are both pointed toward the ceiling and your feet are flat on the floor. Cross your  arms over your chest.  Slightly tip your chin down without bending your neck.  Tense your abdominals and slowly lift your trunk high enough to just clear your shoulder blades. Lifting higher can put excessive stress on the lower back and does not further strengthen your abdominal muscles.  Control your return to the starting position.  STRENGTHENING - Quadruped, Opposite UE/LE Lift   Assume a hands and knees position on a firm surface. Keep your hands under your shoulders and your knees under your hips. You may place padding under your knees for comfort.  Find your neutral spine and gently tense your abdominal muscles so that you can maintain this position. Your shoulders and hips should form a rectangle that is parallel with the floor and is not twisted.  Keeping your trunk steady, lift your right hand no higher than your shoulder and then your left leg no higher than your hip. Make sure you are not holding your breath.   Continuing to keep your abdominal muscles tense and your back steady, slowly return to your starting position. Repeat with the opposite arm and leg.  STRENGTHENING - Lower  Abdominals, Double Knee Lift  Lie on a firm bed or floor. Keeping your legs in front of you, bend your knees so they are both pointed toward the ceiling and your feet are flat on the floor.  Tense your abdominal muscles to brace your lower back and slowly lift both of your knees until they come over your hips. Be certain not to hold your breath.  POSTURE AND BODY MECHANICS CONSIDERATIONS - Low Back Strain Keeping correct posture when sitting, standing or completing your activities will reduce the stress put on different body tissues, allowing injured tissues a chance to heal and limiting painful experiences. The following are general guidelines for improved posture. Your physician or physical therapist will provide you with any instructions specific to your needs. While reading these guidelines, remember:  The exercises prescribed by your provider will help you have the flexibility and strength to maintain correct postures.  The correct posture provides the best environment for your joints to work. All of your joints have less wear and tear when properly supported by a spine with good posture. This means you will experience a healthier, less painful body.  Correct posture must be practiced with all of your activities, especially prolonged sitting and standing. Correct posture is as important when doing repetitive low-stress activities (typing) as it is when doing a single heavy-load activity (lifting). RESTING POSITIONS Consider which positions are most painful for you when choosing a resting position. If you have pain with flexion-based activities (sitting, bending, stooping, squatting), choose a position that allows you to rest in a less flexed posture. You would want to avoid curling into a fetal position on your side. If your pain worsens with extension-based activities (prolonged standing, working overhead), avoid resting in an extended position such as sleeping on your stomach. Most people will  find more comfort when they rest with their spine in a more neutral position, neither too rounded nor too arched. Lying on a non-sagging bed on your side with a pillow between your knees, or on your back with a pillow under your knees will often provide some relief. Keep in mind, being in any one position for a prolonged period of time, no matter how correct your posture, can still lead to stiffness. PROPER SITTING POSTURE In order to minimize stress and discomfort on your spine, you must sit with correct  posture. Sitting with good posture should be effortless for a healthy body. Returning to good posture is a gradual process. Many people can work toward this most comfortably by using various supports until they have the flexibility and strength to maintain this posture on their own. When sitting with proper posture, your ears will fall over your shoulders and your shoulders will fall over your hips. You should use the back of the chair to support your upper back. Your lower back will be in a neutral position, just slightly arched. You may place a small pillow or folded towel at the base of your lower back for support.  When working at a desk, create an environment that supports good, upright posture. Without extra support, muscles tire, which leads to excessive strain on joints and other tissues. Keep these recommendations in mind: CHAIR:  A chair should be able to slide under your desk when your back makes contact with the back of the chair. This allows you to work closely.  The chair's height should allow your eyes to be level with the upper part of your monitor and your hands to be slightly lower than your elbows. BODY POSITION  Your feet should make contact with the floor. If this is not possible, use a foot rest.  Keep your ears over your shoulders. This will reduce stress on your neck and lower back. INCORRECT SITTING POSTURES  If you are feeling tired and unable to assume a healthy sitting  posture, do not slouch or slump. This puts excessive strain on your back tissues, causing more damage and pain. Healthier options include:  Using more support, like a lumbar pillow.  Switching tasks to something that requires you to be upright or walking.  Talking a brief walk.  Lying down to rest in a neutral-spine position. PROLONGED STANDING WHILE SLIGHTLY LEANING FORWARD  When completing a task that requires you to lean forward while standing in one place for a long time, place either foot up on a stationary 2-4 inch high object to help maintain the best posture. When both feet are on the ground, the lower back tends to lose its slight inward curve. If this curve flattens (or becomes too large), then the back and your other joints will experience too much stress, tire more quickly, and can cause pain. CORRECT STANDING POSTURES Proper standing posture should be assumed with all daily activities, even if they only take a few moments, like when brushing your teeth. As in sitting, your ears should fall over your shoulders and your shoulders should fall over your hips. You should keep a slight tension in your abdominal muscles to brace your spine. Your tailbone should point down to the ground, not behind your body, resulting in an over-extended swayback posture.  INCORRECT STANDING POSTURES  Common incorrect standing postures include a forward head, locked knees and/or an excessive swayback. WALKING Walk with an upright posture. Your ears, shoulders and hips should all line-up. PROLONGED ACTIVITY IN A FLEXED POSITION When completing a task that requires you to bend forward at your waist or lean over a low surface, try to find a way to stabilize 3 out of 4 of your limbs. You can place a hand or elbow on your thigh or rest a knee on the surface you are reaching across. This will provide you more stability so that your muscles do not fatigue as quickly. By keeping your knees relaxed, or slightly bent,  you will also reduce stress across your lower back.  CORRECT LIFTING TECHNIQUES DO :   Assume a wide stance. This will provide you more stability and the opportunity to get as close as possible to the object which you are lifting.  Tense your abdominals to brace your spine. Bend at the knees and hips. Keeping your back locked in a neutral-spine position, lift using your leg muscles. Lift with your legs, keeping your back straight.  Test the weight of unknown objects before attempting to lift them.  Try to keep your elbows locked down at your sides in order get the best strength from your shoulders when carrying an object.  Always ask for help when lifting heavy or awkward objects. INCORRECT LIFTING TECHNIQUES DO NOT:   Lock your knees when lifting, even if it is a small object.  Bend and twist. Pivot at your feet or move your feet when needing to change directions.  Assume that you can safely pick up even a paper clip without proper posture.        IF you received an x-ray today, you will receive an invoice from [facility]. Please contact [facility] at [phone] with questions or concerns regarding your invoice.   IF you received labwork today, you will receive an invoice from [facility]. Please contact [facility] at [phone] with questions or concerns regarding your invoice.   Our billing staff will not be able to assist you with questions regarding bills from these companies.  You will be contacted with the lab results as soon as they are available. The fastest way to get your results is to activate your My Chart account. Instructions are located on the last page of this paperwork. If you have not heard from [place] regarding the results in 2 weeks, please contact this office.

## 2017-08-04 NOTE — Progress Notes (Signed)
William Farmer  MRN: [id number] DOB: 1981-11-26  Subjective:   William Farmer is a 36 y.o. male who presents for evaluation of low back pain. The patient has had recurrent self limited episodes of low back pain in the past. These particular symptoms have been present for 2 days and are gradually improving.  Onset was related to / precipitated by bending over and picking up a 60lb box. The pain is located in the right lumbar area and does not radiate. The pain is described as burning and stiffness and occurs all day. He rates his pain as a 6 on a scale of 0-10. Symptoms are exacerbated by twisting and most any movement. Symptoms are improved by heat, NSAIDs and rest. He has also tried muscle relaxants which provided no full symptom relief. Having some left shoulder pain today as well. He denies weakness in the right leg, weakness in the left leg, tingling in the right leg, tingling in the left leg, burning pain in the right leg, burning pain in the left leg, urinary hesitancy, urinary incontinence, urinary retention, bowel incontinence, constipation, impotence and groin/perineal numbness associated with the back pain. The patient has no "red flag" history indicative of complicated back pain. Pt was seen by ED yesterday for this issue, given Rx for naproxen and methocarbomol. He took this last night and it did not work as well as he hoped it would. He has put heat on it in the car but does not have a heating pad at home.    Review of Systems  Constitutional: Negative for chills, diaphoresis and fever.  Musculoskeletal: Negative for neck pain.    Patient Active Problem List   Diagnosis Date Noted  . Mild intermittent asthma 05/19/2017  . 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 Allergic rhinitis due to pollen 05/19/2017  . Testicular cancer ([place]) 06/28/2012    Current Outpatient Prescriptions on File Prior to Visit  Medication Sig Dispense Refill  . albuterol  (PROVENTIL HFA;VENTOLIN HFA) 108 (90 Base) MCG/ACT inhaler Inhale 2 puffs into the lungs every 4 (four) hours as needed for wheezing or shortness of breath (cough, shortness of breath or wheezing.). 1 Inhaler 1  . meloxicam (MOBIC) 15 MG tablet Take 1 tablet (15 mg total) by mouth daily. 30 tablet 0  . methocarbamol (ROBAXIN) 500 MG tablet Take 1 tablet (500 mg total) by mouth at bedtime and may repeat dose one time if needed. 10 tablet 0  . naproxen (NAPROSYN) 500 MG tablet Take 1 tablet (500 mg total) by mouth 2 (two) times daily. 30 tablet 0  . Olopatadine HCl 0.2 % SOLN Apply 1 drop to eye daily as needed. 2.5 mL 0  . Ascorbic Acid (VITAMIN C) 100 MG tablet Take 100 mg by mouth daily.    Marland Kitchen azithromycin (ZITHROMAX) 250 MG tablet Take 2 tabs PO x 1 dose, then 1 tab PO QD x 4 days (Patient not taking: Reported on 08/04/2017) 6 tablet 0  . fluticasone (FLONASE) 50 MCG/ACT nasal spray Place 2 sprays into both nostrils daily. (Patient not taking: Reported on 07/05/2017) 16 g 12  . Guaifenesin (MUCINEX MAXIMUM STRENGTH) 1200 MG TB12 Take 1 tablet (1,200 mg total) by mouth every 12 (twelve) hours as needed. (Patient not taking: Reported on 08/04/2017) 14 tablet 1  . ibuprofen (ADVIL,MOTRIN) 600 MG tablet Take 600 mg by mouth every 6 (six) hours as needed for moderate pain.      No current facility-administered  medications on file prior to visit.     Allergies  Allergen Reactions  . Pollen Extract Shortness Of Breath  . Iodine Hives    Denies respiratory distress but says he breaks out in hives  . Alprazolam     unknown  . Bee Venom Swelling  . Codeine Itching    Hives, twitching  . Shrimp [Shellfish Allergy] Other (See Comments)    Itchy throat     Objective:  BP 126/73 (BP Location: Right Arm, Patient Position: Sitting, Cuff Size: Normal)   Pulse (!) 56   Temp 98.4 F (36.9 C) (Oral)   Resp 18   Ht 6' (1.829 m)   Wt 165 lb 3.2 oz (74.9 kg)   SpO2 99%   BMI 22.41 kg/m   Physical Exam    Constitutional: He is oriented to person, place, and time and well-developed, well-nourished, and in no distress.  HENT:  Head: Normocephalic and atraumatic.  Eyes: Conjunctivae are normal.  Neck: Normal range of motion.  Pulmonary/Chest: Effort normal.  Musculoskeletal:       Right shoulder: He exhibits spasm (is musclature adjacent to medial border of scapula). He exhibits normal range of motion and normal strength.       Left shoulder: Normal.       Cervical back: Normal.       Thoracic back: He exhibits normal range of motion and no bony tenderness. Spasms: noted in right sided musculature.       Lumbar back: He exhibits tenderness (with palpation of spasm) and spasm (noted in right sided musculature). He exhibits normal range of motion, no bony tenderness and no swelling.  Muscle tightness noted in right sided thoracic back musculature.   Neurological: He is alert and oriented to person, place, and time. He has normal strength. He has a normal Straight Leg Raise Test. Gait normal.  Reflex Scores:      Tricep reflexes are 2+ on the right side and 2+ on the left side.      Bicep reflexes are 2+ on the right side and 2+ on the left side.      Brachioradialis reflexes are 2+ on the right side and 2+ on the left side.      Patellar reflexes are 2+ on the right side and 2+ on the left side.      Achilles reflexes are 2+ on the right side and 2+ on the left side. Skin: Skin is warm and dry.  Psychiatric: Affect normal.  Vitals reviewed.      Assessment and Plan :  1. Acute right-sided low back pain without sciatica 2. Muscle spasm Muscle spasms noted throughout right sided musculature of back. No other acute findings on exam. Plan to d/c robaxin and start flexeril daily. Continue naproxen. Thoroughly discussed importance of stretching. Pt given educational material for stretching. Also encouraged hydration and heating pad to affected area 4-5 x a day for 20 min at a time. Also discussed  benefits of massage therapy. Pt encouraged to return to clinic if symptoms worsen, do not improve, or as needed. - cyclobenzaprine (FLEXERIL) 5 MG tablet; Take 1 tablet (5 mg total) by mouth 3 (three) times daily as needed for muscle spasms.  Dispense: 60 tablet; Refill: 0   Tenna Delaine, PA-C  [facility] 08/04/2017 10:41 PM

## 2017-09-13 ENCOUNTER — Encounter: Payer: Self-pay | Admitting: Physician Assistant

## 2017-09-13 ENCOUNTER — Ambulatory Visit (INDEPENDENT_AMBULATORY_CARE_PROVIDER_SITE_OTHER): Payer: BLUE CROSS/BLUE SHIELD | Admitting: Physician Assistant

## 2017-09-13 VITALS — BP 129/83 | HR 66 | Temp 97.6°F | Resp 16 | Ht 72.0 in | Wt 175.0 lb

## 2017-09-13 DIAGNOSIS — Z0000 Encounter for general adult medical examination without abnormal findings: Secondary | ICD-10-CM | POA: Diagnosis not present

## 2017-09-13 DIAGNOSIS — Z1329 Encounter for screening for other suspected endocrine disorder: Secondary | ICD-10-CM | POA: Diagnosis not present

## 2017-09-13 DIAGNOSIS — Z130 Encounter for screening for diseases of the blood and blood-forming organs and certain disorders involving the immune mechanism: Secondary | ICD-10-CM | POA: Diagnosis not present

## 2017-09-13 DIAGNOSIS — Z13228 Encounter for screening for other metabolic disorders: Secondary | ICD-10-CM | POA: Diagnosis not present

## 2017-09-13 DIAGNOSIS — Z13220 Encounter for screening for lipoid disorders: Secondary | ICD-10-CM

## 2017-09-13 MED ORDER — FLUTICASONE PROPIONATE 50 MCG/ACT NA SUSP: 2.0000 | Freq: Every day | NASAL | 12 refills | Status: DC

## 2017-09-13 MED ORDER — CETIRIZINE HCL 10 MG PO TABS: 10.0000 mg | ORAL_TABLET | Freq: Every day | ORAL | 11 refills | Status: DC

## 2017-09-13 NOTE — Progress Notes (Signed)
PRIMARY CARE AT [place], [place] [address] [phone]  Date:  09/13/2017   Name:  William Farmer   DOB:  04-02-1981   MRN:  [id number]  PCP:  Joretta Bachelor, PA    History of Present Illness:  William Farmer is a 36 y.o. male patient who presents to PCP with  Chief Complaint  Patient presents with  . Annual Exam    med refill, allergy med     DIET: good.  Seldom fried foods.  Works he eats Psychologist, prison and probation services.  Water intake: 48oz per day.  Soda: 1 bottle per day. No coffee  BM: constipation.  No black or bloody stool  URINATION: normal.  No dysuria, hematuria, frequency  SLEEP: 6-8 hours per night  SOCIAL ACTIVITY: work.  Play video games.   EtOH: none Tobacco or vaping: none Illicit drug use: none Sexually activity: yes, no difficulties.    Would like medications for allergies.  He is having congestion and allergy symptoms during this season.  No sinus pain at this time.   Patient Active Problem List   Diagnosis Date Noted  . Mild intermittent asthma 05/19/2017  . 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 Allergic rhinitis due to pollen 05/19/2017  . Testicular cancer ([place]) 06/28/2012    Past Medical History:  Diagnosis Date  . Allergy   . testicular ca dx'd 08/2011   surg only    Past Surgical History:  Procedure Laterality Date  . testicular cancer surgery      Social History  Substance Use Topics  . Smoking status: Former Research scientist (life sciences)  . Smokeless tobacco: Never Used  . Alcohol use No    Family History  Problem Relation Age of Onset  . Diabetes Father   . Diabetes Sister     Allergies  Allergen Reactions  . Pollen Extract Shortness Of Breath  . Iodine Hives    Denies respiratory distress but says he breaks out in hives  . Alprazolam     unknown  . Bee Venom Swelling  . Codeine Itching    Hives, twitching  . Shrimp [Shellfish Allergy] Other (See Comments)    Itchy throat    Medication list has been  reviewed and updated.  Current Outpatient Prescriptions on File Prior to Visit  Medication Sig Dispense Refill  . albuterol (PROVENTIL HFA;VENTOLIN HFA) 108 (90 Base) MCG/ACT inhaler Inhale 2 puffs into the lungs every 4 (four) hours as needed for wheezing or shortness of breath (cough, shortness of breath or wheezing.). 1 Inhaler 1  . Ascorbic Acid (VITAMIN C) 100 MG tablet Take 100 mg by mouth daily.    . cyclobenzaprine (FLEXERIL) 5 MG tablet Take 1 tablet (5 mg total) by mouth 3 (three) times daily as needed for muscle spasms. 60 tablet 0  . ibuprofen (ADVIL,MOTRIN) 600 MG tablet Take 600 mg by mouth every 6 (six) hours as needed for moderate pain.     . meloxicam (MOBIC) 15 MG tablet Take 1 tablet (15 mg total) by mouth daily. 30 tablet 0  . methocarbamol (ROBAXIN) 500 MG tablet Take 1 tablet (500 mg total) by mouth at bedtime and may repeat dose one time if needed. 10 tablet 0  . naproxen (NAPROSYN) 500 MG tablet Take 1 tablet (500 mg total) by mouth 2 (two) times daily. 30 tablet 0  . Olopatadine HCl 0.2 % SOLN Apply 1 drop to eye daily as needed. 2.5 mL 0   No  current facility-administered medications on file prior to visit.     Review of Systems  Constitutional: Negative for chills and fever.  HENT: Positive for congestion. Negative for ear discharge, ear pain and sore throat.   Eyes: Negative for blurred vision and double vision.  Respiratory: Negative for cough, shortness of breath and wheezing.   Cardiovascular: Negative for chest pain, palpitations and leg swelling.  Gastrointestinal: Negative for diarrhea, nausea and vomiting.  Genitourinary: Negative for dysuria, frequency and hematuria.  Skin: Negative for itching and rash.  Neurological: Negative for dizziness and headaches.   ROS otherwise unremarkable unless listed above.  Physical Examination: BP 129/83   Pulse 66   Temp 97.6 F (36.4 C) (Oral)   Resp 16   Ht 6' (1.829 m)   Wt 175 lb (79.4 kg)   SpO2 99%    BMI 23.73 kg/m  Ideal Body Weight: Weight in (lb) to have BMI = 25: 183.9  Physical Exam  Constitutional: He is oriented to person, place, and time. He appears well-developed and well-nourished. No distress.  HENT:  Head: Normocephalic and atraumatic.  Right Ear: Tympanic membrane, external ear and ear canal normal.  Left Ear: Tympanic membrane, external ear and ear canal normal.  Eyes: Pupils are equal, round, and reactive to light. Conjunctivae and EOM are normal.  Cardiovascular: Normal rate and regular rhythm.  Exam reveals no friction rub.   No murmur heard. Pulmonary/Chest: Effort normal. No respiratory distress. He has no wheezes.  Abdominal: Soft. Bowel sounds are normal. He exhibits no distension and no mass. There is no tenderness.  Genitourinary:  Genitourinary Comments: The testicle appreciated normal without mass or tenderness.  Musculoskeletal: Normal range of motion. He exhibits no edema or tenderness.  Neurological: He is alert and oriented to person, place, and time. He displays normal reflexes.  Skin: Skin is warm and dry. He is not diaphoretic.  Psychiatric: He has a normal mood and affect. His behavior is normal.      Visual Acuity Screening   Right eye Left eye Both eyes  Without correction: '20/15 20/13 20/13 '  With correction:        Assessment and Plan: William Farmer is a 36 y.o. male who is here today for  Chief Complaint  Patient presents with  . Annual Exam    med refill, allergy med   --given allergy medication --labs pending  Annual physical exam - Plan: CBC, CMP14+EGFR, TSH, Lipid panel  Screening for deficiency anemia - Plan: CBC  Screening for metabolic disorder - Plan: CMP14+EGFR  Screening for thyroid disorder - Plan: TSH  Screening for lipid disorders - Plan: Lipid panel  Ivar Drape, PA-C [facility] 9/20/20188:59 AM

## 2017-09-13 NOTE — Patient Instructions (Addendum)
IF you received an x-ray today, you will receive an invoice from [facility]. Please contact [facility] at [phone] with questions or concerns regarding your invoice.   IF you received labwork today, you will receive an invoice from [facility]. Please contact [facility] at [phone] with questions or concerns regarding your invoice.   Our billing staff will not be able to assist you with questions regarding bills from these companies.  You will be contacted with the lab results as soon as they are available. The fastest way to get your results is to activate your My Chart account. Instructions are located on the last page of this paperwork. If you have not heard from [place] regarding the results in 2 weeks, please contact this office.     Keeping you healthy  Get these tests  Blood pressure- Have your blood pressure checked once a year by your healthcare provider.  Normal blood pressure is 120/80.  Weight- Have your body mass index (BMI) calculated to screen for obesity.  BMI is a measure of body fat based on height and weight. You can also calculate your own BMI at [employer].it.  Cholesterol- Have your cholesterol checked regularly starting at age 4, sooner may be necessary if you have diabetes, high blood pressure, if a family member developed heart diseases at an early age or if you smoke.   Chlamydia, HIV, and other sexual transmitted disease- Get screened each year until the age of 69 then within three months of each new sexual partner.  Diabetes- Have your blood sugar checked regularly if you have high blood pressure, high cholesterol, a family history of diabetes or if you are overweight.  Get these vaccines  Flu shot- Every fall.  Tetanus shot- Every 10 years.  Menactra- Single dose; prevents meningitis.  Take these steps  Don't smoke- If you do smoke, ask your healthcare provider about quitting. For tips on how to quit, go to  www.smokefree.gov or call 1-800-QUIT-NOW.  Be physically active- Exercise 5 days a week for at least 30 minutes.  If you are not already physically active start slow and gradually work up to 30 minutes of moderate physical activity.  Examples of moderate activity include walking briskly, mowing the yard, dancing, swimming bicycling, etc.  Eat a healthy diet- Eat a variety of healthy foods such as fruits, vegetables, low fat milk, low fat cheese, yogurt, lean meats, poultry, fish, beans, tofu, etc.  For more information on healthy eating, go to www.thenutritionsource.org  Drink alcohol in moderation- Limit alcohol intake two drinks or less a day.  Never drink and drive.  Dentist- Brush and floss teeth twice daily; visit your dentis twice a year.  Depression-Your emotional health is as important as your physical health.  If you're feeling down, losing interest in things you normally enjoy please talk with your healthcare provider.  Gun Safety- If you keep a gun in your home, keep it unloaded and with the safety lock on.  Bullets should be stored separately.  Helmet use- Always wear a helmet when riding a motorcycle, bicycle, rollerblading or skateboarding.  Safe sex- If you may be exposed to a sexually transmitted infection, use a condom  Seat belts- Seat bels can save your life; always wear one.  Smoke/Carbon Monoxide detectors- These detectors need to be installed on the appropriate level of your home.  Replace batteries at least once a year.  Skin Cancer- When out in the sun, cover up and use sunscreen SPF 34 or  higher.  Violence- If anyone is threatening or hurting you, please tell your healthcare provider.

## 2017-09-14 LAB — CMP14+EGFR
ALK PHOS: 77 IU/L (ref 39–117)
ALT: 25 IU/L (ref 0–44)
AST: 22 IU/L (ref 0–40)
Albumin/Globulin Ratio: 1.7 (ref 1.2–2.2)
Albumin: 4.6 g/dL (ref 3.5–5.5)
BUN/Creatinine Ratio: 8 — ABNORMAL LOW (ref 9–20)
BUN: 10 mg/dL (ref 6–20)
Bilirubin Total: 0.6 mg/dL (ref 0.0–1.2)
CO2: 24 mmol/L (ref 20–29)
Calcium: 9.3 mg/dL (ref 8.7–10.2)
Chloride: 102 mmol/L (ref 96–106)
Creatinine, Ser: 1.27 mg/dL (ref 0.76–1.27)
GFR calc Af Amer: 85 mL/min/{1.73_m2} (ref >59)
GFR calc non Af Amer: 73 mL/min/{1.73_m2} (ref >59)
Globulin, Total: 2.7 g/dL (ref 1.5–4.5)
Glucose: 71 mg/dL (ref 65–99)
Potassium: 4.2 mmol/L (ref 3.5–5.2)
Sodium: 141 mmol/L (ref 134–144)
Total Protein: 7.3 g/dL (ref 6.0–8.5)

## 2017-09-14 LAB — LIPID PANEL
Chol/HDL Ratio: 3 ratio (ref 0.0–5.0)
Cholesterol, Total: 173 mg/dL (ref 100–199)
HDL: 58 mg/dL (ref >39)
LDL Calculated: 94 mg/dL (ref 0–99)
Triglycerides: 105 mg/dL (ref 0–149)
VLDL Cholesterol Cal: 21 mg/dL (ref 5–40)

## 2017-09-14 LAB — CBC
Hematocrit: 44.5 % (ref 37.5–51.0)
Hemoglobin: 14.8 g/dL (ref 13.0–17.7)
MCH: 30.5 pg (ref 26.6–33.0)
MCHC: 33.3 g/dL (ref 31.5–35.7)
MCV: 92 fL (ref 79–97)
Platelets: 225 10*3/uL (ref 150–379)
RBC: 4.86 x10E6/uL (ref 4.14–5.80)
RDW: 14.1 % (ref 12.3–15.4)
WBC: 4.3 10*3/uL (ref 3.4–10.8)

## 2017-09-14 LAB — TSH: TSH: 1.96 u[IU]/mL (ref 0.450–4.500)

## 2017-09-28 ENCOUNTER — Ambulatory Visit (INDEPENDENT_AMBULATORY_CARE_PROVIDER_SITE_OTHER): Payer: BLUE CROSS/BLUE SHIELD | Admitting: Physician Assistant

## 2017-09-28 ENCOUNTER — Encounter: Payer: Self-pay | Admitting: Physician Assistant

## 2017-09-28 VITALS — BP 118/84 | HR 72 | Temp 98.7°F | Resp 16 | Ht 71.0 in | Wt 170.4 lb

## 2017-09-28 DIAGNOSIS — K219 Gastro-esophageal reflux disease without esophagitis: Secondary | ICD-10-CM | POA: Diagnosis not present

## 2017-09-28 DIAGNOSIS — K5900 Constipation, unspecified: Secondary | ICD-10-CM

## 2017-09-28 DIAGNOSIS — R51 Headache: Secondary | ICD-10-CM | POA: Diagnosis not present

## 2017-09-28 DIAGNOSIS — R519 Headache, unspecified: Secondary | ICD-10-CM

## 2017-09-28 MED ORDER — RANITIDINE HCL 150 MG PO TABS: 150.0000 mg | ORAL_TABLET | Freq: Two times a day (BID) | ORAL | 0 refills | Status: DC

## 2017-09-28 MED ORDER — IBUPROFEN 800 MG PO TABS: 800.0000 mg | ORAL_TABLET | Freq: Three times a day (TID) | ORAL | 0 refills | Status: DC | PRN

## 2017-09-28 MED ORDER — POLYETHYLENE GLYCOL 3350 17 GM/SCOOP PO POWD: 17.0000 g | Freq: Two times a day (BID) | ORAL | 1 refills | Status: DC | PRN

## 2017-09-28 NOTE — Progress Notes (Signed)
PRIMARY CARE AT [facility] [address], [place] [address] [phone]  Date:  09/28/2017   Name:  William Farmer   DOB:  11/12/1981   MRN:  [id number]  PCP:  Joretta Bachelor, PA    History of Present Illness:  William Farmer is a 36 y.o. male patient who presents to PCP with  Chief Complaint  Patient presents with  . Headache    x 4 days     Frontal head pain and in the back of head.  Constant pressure.  He has noticed some change in vision where it was purple color, when it is generally blue.  He has some blurry vision.  bending down aggravates the pain.  He has some nasal congestion.   He is currently taking allergy pills, cetirizine.  He is not using the nasal spray.   He currently has nausea, and heartburn.  Eructation and flatulence.  He does have some GERD symptoms in the past.  He has no sob or chest pains.    Patient Active Problem List   Diagnosis Date Noted  . Mild intermittent asthma 05/19/2017  . 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 Allergic rhinitis due to pollen 05/19/2017  . Testicular cancer ([place]) 06/28/2012    Past Medical History:  Diagnosis Date  . Allergy   . testicular ca dx'd 08/2011   surg only    Past Surgical History:  Procedure Laterality Date  . testicular cancer surgery      Social History  Substance Use Topics  . Smoking status: Former Research scientist (life sciences)  . Smokeless tobacco: Never Used  . Alcohol use No    Family History  Problem Relation Age of Onset  . Diabetes Father   . Diabetes Sister     Allergies  Allergen Reactions  . Pollen Extract Shortness Of Breath  . Iodine Hives    Denies respiratory distress but says he breaks out in hives  . Alprazolam     unknown  . Bee Venom Swelling  . Codeine Itching    Hives, twitching  . Shrimp [Shellfish Allergy] Other (See Comments)    Itchy throat    Medication list has been reviewed and updated.  Current Outpatient Prescriptions on File Prior to  Visit  Medication Sig Dispense Refill  . cetirizine (ZYRTEC ALLERGY) 10 MG tablet Take 1 tablet (10 mg total) by mouth daily. 30 tablet 11  . cyclobenzaprine (FLEXERIL) 5 MG tablet Take 1 tablet (5 mg total) by mouth 3 (three) times daily as needed for muscle spasms. 60 tablet 0  . fluticasone (FLONASE) 50 MCG/ACT nasal spray Place 2 sprays into both nostrils daily. 16 g 12  . ibuprofen (ADVIL,MOTRIN) 600 MG tablet Take 600 mg by mouth every 6 (six) hours as needed for moderate pain.     . meloxicam (MOBIC) 15 MG tablet Take 1 tablet (15 mg total) by mouth daily. 30 tablet 0  . naproxen (NAPROSYN) 500 MG tablet Take 1 tablet (500 mg total) by mouth 2 (two) times daily. 30 tablet 0  . Olopatadine HCl 0.2 % SOLN Apply 1 drop to eye daily as needed. 2.5 mL 0  . albuterol (PROVENTIL HFA;VENTOLIN HFA) 108 (90 Base) MCG/ACT inhaler Inhale 2 puffs into the lungs every 4 (four) hours as needed for wheezing or shortness of breath (cough, shortness of breath or wheezing.). (Patient not taking: Reported on 09/28/2017) 1 Inhaler 1  . Ascorbic Acid (VITAMIN C) 100 MG tablet  Take 100 mg by mouth daily.    . methocarbamol (ROBAXIN) 500 MG tablet Take 1 tablet (500 mg total) by mouth at bedtime and may repeat dose one time if needed. (Patient not taking: Reported on 09/28/2017) 10 tablet 0   No current facility-administered medications on file prior to visit.     ROS ROS otherwise unremarkable unless listed above.  Physical Examination: BP 118/84   Pulse 72   Temp 98.7 F (37.1 C) (Oral)   Resp 16   Ht 5\' 11"  (1.803 m)   Wt 170 lb 6.4 oz (77.3 kg)   SpO2 98%   BMI 23.77 kg/m  Ideal Body Weight: Weight in (lb) to have BMI = 25: 178.9  Physical Exam  Constitutional: He is oriented to person, place, and time. He appears well-developed and well-nourished. No distress.  HENT:  Head: Normocephalic and atraumatic.  Eyes: Pupils are equal, round, and reactive to light. Conjunctivae and EOM are normal.   Cardiovascular: Normal rate, regular rhythm, normal heart sounds and intact distal pulses.  Exam reveals no friction rub.   No murmur heard. Pulmonary/Chest: Effort normal. No respiratory distress. He has no decreased breath sounds. He has no wheezes. He has no rhonchi.  Abdominal: Soft. Normal appearance and bowel sounds are normal. There is generalized tenderness. There is no tenderness at McBurney's point and negative Murphy's sign.  Neurological: He is alert and oriented to person, place, and time. He has normal strength. He is not disoriented. No cranial nerve deficit. Coordination and gait normal.  Skin: Skin is warm and dry. He is not diaphoretic.  Psychiatric: He has a normal mood and affect. His behavior is normal.     Assessment and Plan: [name] is a 36 y.o. male who is here today for cc of headache.   Patient declines any imaging of ct scan at this time, after acute changes may be present Advising h2 blocker, restarting his Flonase, and miralax at this time.  He will continue to hydrate.  Given GERD restricted diet. rtc if sxs do not improve. Constipation, unspecified constipation type - Plan: polyethylene glycol powder (GLYCOLAX/MIRALAX) powder  Gastroesophageal reflux disease, esophagitis presence not specified - Plan: ranitidine (ZANTAC) 150 MG tablet  Nonintractable headache, unspecified chronicity pattern, unspecified headache type - Plan: ibuprofen (ADVIL,MOTRIN) 800 MG tablet  Ivar Drape, PA-C [facility] 10/2/20181:16 PM

## 2017-09-28 NOTE — Patient Instructions (Addendum)
Please make sure you are hydrating well Take the miralax twice per day for no more than 1 week. I would like you to follow the gerd reflux diet.   Food Choices for Gastroesophageal Reflux Disease, Adult When you have gastroesophageal reflux disease (GERD), the foods you eat and your eating habits are very important. Choosing the right foods can help ease your discomfort. What guidelines do I need to follow?  Choose fruits, vegetables, whole grains, and low-fat dairy products.  Choose low-fat meat, fish, and poultry.  Limit fats such as oils, salad dressings, butter, nuts, and avocado.  Keep a food diary. This helps you identify foods that cause symptoms.  Avoid foods that cause symptoms. These may be different for everyone.  Eat small meals often instead of 3 large meals a day.  Eat your meals slowly, in a place where you are relaxed.  Limit fried foods.  Cook foods using methods other than frying.  Avoid drinking alcohol.  Avoid drinking large amounts of liquids with your meals.  Avoid bending over or lying down until 2-3 hours after eating. What foods are not recommended? These are some foods and drinks that may make your symptoms worse: Vegetables Tomatoes. Tomato juice. Tomato and spaghetti sauce. Chili peppers. Onion and garlic. Horseradish. Fruits Oranges, grapefruit, and lemon (fruit and juice). Meats High-fat meats, fish, and poultry. This includes hot dogs, ribs, ham, sausage, salami, and bacon. Dairy Whole milk and chocolate milk. Sour cream. Cream. Butter. Ice cream. Cream cheese. Drinks Coffee and tea. Bubbly (carbonated) drinks or energy drinks. Condiments Hot sauce. Barbecue sauce. Sweets/Desserts Chocolate and cocoa. Donuts. Peppermint and spearmint. Fats and Oils High-fat foods. This includes [place] fries and potato chips. Other Vinegar. Strong spices. This includes black pepper, white pepper, red pepper, cayenne, curry powder, cloves, ginger, and  chili powder. The items listed above may not be a complete list of foods and drinks to avoid. Contact your dietitian for more information. This information is not intended to replace advice given to you by your health care provider. Make sure you discuss any questions you have with your health care provider. Document Released: 06/14/2012 Document Revised: 05/21/2016 Document Reviewed: 10/18/2013 Elsevier Interactive Patient Education  2017 [employer].     IF you received an x-ray today, you will receive an invoice from [facility]. Please contact [facility] at [phone] with questions or concerns regarding your invoice.   IF you received labwork today, you will receive an invoice from [facility]. Please contact [facility] at [phone] with questions or concerns regarding your invoice.   Our billing staff will not be able to assist you with questions regarding bills from these companies.  You will be contacted with the lab results as soon as they are available. The fastest way to get your results is to activate your My Chart account. Instructions are located on the last page of this paperwork. If you have not heard from [place] regarding the results in 2 weeks, please contact this office.

## 2017-11-10 ENCOUNTER — Ambulatory Visit: Payer: BLUE CROSS/BLUE SHIELD | Admitting: Physician Assistant

## 2018-03-10 ENCOUNTER — Encounter: Payer: Self-pay | Admitting: Physician Assistant

## 2018-03-10 ENCOUNTER — Ambulatory Visit: Payer: BLUE CROSS/BLUE SHIELD | Admitting: Physician Assistant

## 2018-03-10 VITALS — BP 114/77 | HR 62 | Temp 98.1°F | Resp 18 | Ht 71.0 in | Wt 170.0 lb

## 2018-03-10 DIAGNOSIS — J302 Other seasonal allergic rhinitis: Secondary | ICD-10-CM | POA: Diagnosis not present

## 2018-03-10 DIAGNOSIS — Z889 Allergy status to unspecified drugs, medicaments and biological substances status: Secondary | ICD-10-CM

## 2018-03-10 MED ORDER — CETIRIZINE-PSEUDOEPHEDRINE ER 5-120 MG PO TB12: 1.0000 | ORAL_TABLET | Freq: Two times a day (BID) | ORAL | 0 refills | Status: AC

## 2018-03-10 MED ORDER — FLUTICASONE PROPIONATE 50 MCG/ACT NA SUSP: 2.0000 | Freq: Every day | NASAL | 12 refills | Status: DC

## 2018-03-10 MED ORDER — OLOPATADINE HCL 0.2 % OP SOLN: 1.0000 [drp] | Freq: Every day | OPHTHALMIC | 0 refills | Status: DC | PRN

## 2018-03-10 MED ORDER — METHYLPREDNISOLONE ACETATE 80 MG/ML IJ SUSP
80.0000 mg | Freq: Once | INTRAMUSCULAR | Status: AC
  Administered 2018-03-10: 80 mg via INTRAMUSCULAR

## 2018-03-10 NOTE — Patient Instructions (Addendum)
  With the injection I expect that she will be feeling better in the next 24 hours.  Please start all of your allergy medications.  I would stop the Zyrtec-D after 4-5 days and move back to just regular Zyrtec.  This is not a prescription and thus you can pick up any Zyrtec or cetirizine generic medication and continue this throughout allergy season.   IF you received an x-ray today, you will receive an invoice from [facility]. Please contact [facility] at [phone] with questions or concerns regarding your invoice.   IF you received labwork today, you will receive an invoice from [facility]. Please contact [facility] at [phone] with questions or concerns regarding your invoice.   Our billing staff will not be able to assist you with questions regarding bills from these companies.  You will be contacted with the lab results as soon as they are available. The fastest way to get your results is to activate your My Chart account. Instructions are located on the last page of this paperwork. If you have not heard from [place] regarding the results in 2 weeks, please contact this office.

## 2018-03-10 NOTE — Progress Notes (Signed)
    03/10/2018 10:53 AM   DOB: [birth year] / MRN: [id number]  SUBJECTIVE:  William Farmer is a 37 y.o. male presenting for for a flare of seasonal allergies.  staried a week ago. Complains of eye itching and sneezing.  He has history of fairly severe seasonal allergies.  Denies fever.  He is allergic to pollen extract; iodine; alprazolam; bee venom; codeine; and shrimp [shellfish allergy].   He  has a past medical history of Allergy and testicular ca (dx'd 08/2011).    He  reports that he has quit smoking. he has never used smokeless tobacco. He reports that he does not drink alcohol or use drugs. He  has no sexual activity history on file. The patient  has a past surgical history that includes testicular cancer surgery.  His family history includes Diabetes in his father and sister.  ROS per HPI  The problem list and medications were reviewed and updated by myself where necessary and exist elsewhere in the encounter.   OBJECTIVE:  There were no vitals taken for this visit.  Physical Exam  Constitutional: He appears well-developed. He is active and cooperative.  Non-toxic appearance.  Eyes:  Nasal turbinates swollen and pale with a bluish hue.  Bilateral eyes appear to be irritated.  Cardiovascular: Normal rate, regular rhythm, S1 normal, S2 normal, normal heart sounds, intact distal pulses and normal pulses. Exam reveals no gallop and no friction rub.  No murmur heard. Pulmonary/Chest: Effort normal. No stridor. No tachypnea. No respiratory distress. He has no wheezes. He has no rales.  Abdominal: He exhibits no distension.  Musculoskeletal: He exhibits no edema.  Neurological: He is alert.  Skin: Skin is warm and dry. He is not diaphoretic. No pallor.  Vitals reviewed.   No results found for this or any previous visit (from the past 72 hour(s)).  No results found.  ASSESSMENT AND PLAN:  William Farmer was seen today for allergies, sinus problem and medication refill.  Diagnoses  and all orders for this visit:  Seasonal allergies -     methylPREDNISolone acetate (DEPO-MEDROL) injection 80 mg -     cetirizine-pseudoephedrine (ZYRTEC-D) 5-120 MG tablet; Take 1 tablet by mouth 2 (two) times daily for 7 days. -     fluticasone (FLONASE) 50 MCG/ACT nasal spray; Place 2 sprays into both nostrils daily. -     Recheck vitals -     Care order/instruction:  Multiple allergies -     Olopatadine HCl 0.2 % SOLN; Apply 1 drop to eye daily as needed. -     Care order/instruction:    The patient is advised to call or return to clinic if he does not see an improvement in symptoms, or to seek the care of the closest emergency department if he worsens with the above plan.   William Farmer, MHS, PA-C [facility] 03/10/2018 10:53 AM

## 2018-03-21 ENCOUNTER — Encounter: Payer: Self-pay | Admitting: Physician Assistant

## 2018-03-21 ENCOUNTER — Other Ambulatory Visit: Payer: Self-pay

## 2018-03-21 ENCOUNTER — Ambulatory Visit: Payer: BLUE CROSS/BLUE SHIELD | Admitting: Physician Assistant

## 2018-03-21 VITALS — BP 113/68 | HR 54 | Temp 98.4°F | Resp 18 | Ht 71.0 in | Wt 171.4 lb

## 2018-03-21 DIAGNOSIS — M62830 Muscle spasm of back: Secondary | ICD-10-CM

## 2018-03-21 MED ORDER — CYCLOBENZAPRINE HCL 10 MG PO TABS: 5.0000 mg | ORAL_TABLET | Freq: Three times a day (TID) | ORAL | 0 refills | Status: DC | PRN

## 2018-03-21 MED ORDER — MELOXICAM 15 MG PO TABS: 15.0000 mg | ORAL_TABLET | Freq: Every day | ORAL | 0 refills | Status: DC

## 2018-03-21 NOTE — Progress Notes (Signed)
PRIMARY CARE AT [facility] [address] [place] [address] [phone]  Date:  03/21/2018   Name:  William Farmer   DOB:  Oct 09, 1981   MRN:  [id number]  PCP:  Joretta Bachelor, PA    History of Present Illness:  William Farmer is a 37 y.o. male patient who presents to PCP with  Chief Complaint  Patient presents with  . back spasms x yesterday    pt has hx of back injury and anything can trigger it, pt unsure of what triggered spasms, pain level today 7/10 and yesterday pain level with movement 9-10/10.  Pt took a muscle relaxer last night and it helped him sleep.  Upon waking up this morning pt could not get out of bed due to pain he had to have his wife help him out of bed.  Pain is sharp and lingers.        Patient Active Problem List   Diagnosis Date Noted  . Mild intermittent asthma 05/19/2017  . 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 Allergic rhinitis due to pollen 05/19/2017  . Testicular cancer ([place]) 06/28/2012    Past Medical History:  Diagnosis Date  . Allergy   . testicular ca dx'd 08/2011   surg only    Past Surgical History:  Procedure Laterality Date  . testicular cancer surgery      Social History   Tobacco Use  . Smoking status: Former Research scientist (life sciences)  . Smokeless tobacco: Never Used  Substance Use Topics  . Alcohol use: No    Alcohol/week: 0.0 oz  . Drug use: No    Family History  Problem Relation Age of Onset  . Diabetes Father   . Diabetes Sister     Allergies  Allergen Reactions  . Pollen Extract Shortness Of Breath  . Iodine Hives    Denies respiratory distress but says he breaks out in hives  . Alprazolam     unknown  . Bee Venom Swelling  . Codeine Itching    Hives, twitching  . Shrimp [Shellfish Allergy] Other (See Comments)    Itchy throat    Medication list has been reviewed and updated.  Current Outpatient Medications on File Prior to Visit  Medication Sig Dispense Refill  . cetirizine  (ZYRTEC ALLERGY) 10 MG tablet Take 1 tablet (10 mg total) by mouth daily. 30 tablet 11  . fluticasone (FLONASE) 50 MCG/ACT nasal spray Place 2 sprays into both nostrils daily. 16 g 12  . Olopatadine HCl 0.2 % SOLN Apply 1 drop to eye daily as needed. 2.5 mL 0   No current facility-administered medications on file prior to visit.     ROS ROS otherwise unremarkable unless listed above.  Physical Examination: BP 113/68 (BP Location: Right Arm, Patient Position: Sitting, Cuff Size: Normal)   Pulse (!) 54   Temp 98.4 F (36.9 C) (Oral)   Resp 18   Ht 5\' 11"  (1.803 m)   Wt 171 lb 6.4 oz (77.7 kg)   SpO2 99%   BMI 23.91 kg/m  Ideal Body Weight: Weight in (lb) to have BMI = 25: 178.9  Physical Exam   Assessment and Plan: William Farmer is a 37 y.o. male who is here today  There are no diagnoses linked to this encounter.  Ivar Drape, PA-C [facility] 03/21/2018 12:39 PM

## 2018-03-21 NOTE — Progress Notes (Signed)
PRIMARY CARE AT [facility] [address] [place] [address] [phone]  Date:  03/21/2018   Name:  William Farmer   DOB:  June 06, 1981   MRN:  [id number]  PCP:  Joretta Bachelor, PA    History of Present Illness:  William Farmer is a 37 y.o. male patient who presents to PCP with  Chief Complaint  Patient presents with  . back spasms x yesterday    pt has hx of back injury and anything can trigger it, pt unsure of what triggered spasms, pain level today 7/10 and yesterday pain level with movement 9-10/10.  Pt took a muscle relaxer last night and it helped him sleep.  Upon waking up this morning pt could not get out of bed due to pain he had to have his wife help him out of bed.  Pain is sharp and lingers.     midlback pain bilaterally both sides.  He has any turning of the torso, there is aggravation.  He does not recall any trauma or extra movement that could have caused the symptoms.   He took a muscle relaxer which helped his symptoms.  He woke up this morning, and felt like he could not move or reach for anything.  Denies chanign sleeping position of sleeping area.    Patient Active Problem List   Diagnosis Date Noted  . Mild intermittent asthma 05/19/2017  . 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 Allergic rhinitis due to pollen 05/19/2017  . Testicular cancer ([place]) 06/28/2012    Past Medical History:  Diagnosis Date  . Allergy   . testicular ca dx'd 08/2011   surg only    Past Surgical History:  Procedure Laterality Date  . testicular cancer surgery      Social History   Tobacco Use  . Smoking status: Former Research scientist (life sciences)  . Smokeless tobacco: Never Used  Substance Use Topics  . Alcohol use: No    Alcohol/week: 0.0 oz  . Drug use: No    Family History  Problem Relation Age of Onset  . Diabetes Father   . Diabetes Sister     Allergies  Allergen Reactions  . Pollen Extract Shortness Of Breath  . Iodine Hives    Denies  respiratory distress but says he breaks out in hives  . Alprazolam     unknown  . Bee Venom Swelling  . Codeine Itching    Hives, twitching  . Shrimp [Shellfish Allergy] Other (See Comments)    Itchy throat    Medication list has been reviewed and updated.  Current Outpatient Medications on File Prior to Visit  Medication Sig Dispense Refill  . cetirizine (ZYRTEC ALLERGY) 10 MG tablet Take 1 tablet (10 mg total) by mouth daily. 30 tablet 11  . fluticasone (FLONASE) 50 MCG/ACT nasal spray Place 2 sprays into both nostrils daily. 16 g 12  . Olopatadine HCl 0.2 % SOLN Apply 1 drop to eye daily as needed. 2.5 mL 0   No current facility-administered medications on file prior to visit.     ROS ROS otherwise unremarkable unless listed above.  Physical Examination: BP 113/68 (BP Location: Right Arm, Patient Position: Sitting, Cuff Size: Normal)   Pulse (!) 54   Temp 98.4 F (36.9 C) (Oral)   Resp 18   Ht 5\' 11"  (1.803 m)   Wt 171 lb 6.4 oz (77.7 kg)   SpO2 99%   BMI 23.91 kg/m  Ideal Body Weight:  Weight in (lb) to have BMI = 25: 178.9  Physical Exam  Constitutional: He is oriented to person, place, and time. He appears well-developed and well-nourished. No distress.  HENT:  Head: Normocephalic and atraumatic.  Eyes: Pupils are equal, round, and reactive to light. Conjunctivae and EOM are normal.  Cardiovascular: Normal rate.  Pulmonary/Chest: Effort normal. No respiratory distress.  Musculoskeletal:       Thoracic back: He exhibits decreased range of motion (tenderness with lateral deviation and torso rotation) and tenderness. He exhibits no bony tenderness and no spasm.  Neurological: He is alert and oriented to person, place, and time.  Skin: Skin is warm and dry. He is not diaphoretic.  Psychiatric: He has a normal mood and affect. His behavior is normal.     Assessment and Plan: [name] is a 37 y.o. male who is here today for cc of  Chief Complaint  Patient  presents with  . back spasms x yesterday    pt has hx of back injury and anything can trigger it, pt unsure of what triggered spasms, pain level today 7/10 and yesterday pain level with movement 9-10/10.  Pt took a muscle relaxer last night and it helped him sleep.  Upon waking up this morning pt could not get out of bed due to pain he had to have his wife help him out of bed.  Pain is sharp and lingers.  advised nsaid and msk relaxant.  Precautions discussed. 2 week follow up if no improvement.  Icing and stretches given.  Discussed the importance of conditioning and core strengthening. Back muscle spasm - Plan: cyclobenzaprine (FLEXERIL) 10 MG tablet, meloxicam (MOBIC) 15 MG tablet  Ivar Drape, PA-C [facility] 3/26/201911:03 AM

## 2018-03-21 NOTE — Patient Instructions (Addendum)
Do not take the meloxicam with ibuprofen or naprosen. The flexeril can cause sedation, so please be cautious.  Do not operate heavy machinery with this medication.   Mid-Back Strain Rehab Ask your health care provider which exercises are safe for you. Do exercises exactly as told by your health care provider and adjust them as directed. It is normal to feel mild stretching, pulling, tightness, or discomfort as you do these exercises, but you should stop right away if you feel sudden pain or your pain gets worse. Do not begin these exercises until told by your health care provider. Stretching and range of motion exercises This exercise warms up your muscles and joints and improves the movement and flexibility of your back and shoulders. This exercise also help to relieve pain. Exercise A: Chest and spine stretch  1. Lie down on your back on a firm surface. 2. Roll a towel or a small blanket so it is about 4 inches (10 cm) in diameter. 3. Put the towel lengthwise under the middle of your back so it is under your spine, but not under your shoulder blades. 4. To increase the stretch, you may put your hands behind your head and let your elbows fall to your sides. 5. Hold for __________ seconds. Repeat exercise __________ times. Complete this exercise __________ times a day. Strengthening exercises These exercises build strength and endurance in your back and your shoulder blade muscles. Endurance is the ability to use your muscles for a long time, even after they get tired. Exercise B: Alternating arm and leg raises  1. Get on your hands and knees on a firm surface. If you are on a hard floor, you may want to use padding to cushion your knees, such as an exercise mat. 2. Line up your arms and legs. Your hands should be below your shoulders, and your knees should be below your hips. 3. Lift your left leg behind you. At the same time, raise your right arm and straighten it in front of you. ? Do not  lift your leg higher than your hip. ? Do not lift your arm higher than your shoulder. ? Keep your abdominal and back muscles tight. ? Keep your hips facing the ground. ? Do not arch your back. ? Keep your balance carefully, and do not hold your breath. 4. Hold for __________ seconds. 5. Slowly return to the starting position and repeat with your right leg and your left arm. Repeat __________ times. Complete this exercise __________ times a day. Exercise C: Straight arm rows ( shoulder extension) 1. Stand with your feet shoulder width apart. 2. Secure an exercise band to a stable object in front of you so the band is at or above shoulder height. 3. Hold one end of the exercise band in each hand. 4. Straighten your elbows and lift your hands up to shoulder height. 5. Step back, away from the secured end of the exercise band, until the band stretches. 6. Squeeze your shoulder blades together and pull your hands down to the sides of your thighs. Stop when your hands are straight down by your sides. Do not let your hands go behind your body. 7. Hold for __________ seconds. 8. Slowly return to the starting position. Repeat __________ times. Complete this exercise __________ times a day. Exercise D: Shoulder external rotation, prone 1. Lie on your abdomen on a firm bed so your left / right forearm hangs over the edge of the bed and your upper arm is  on the bed, straight out from your body. ? Your elbow should be bent. ? Your palm should be facing your feet. 2. If instructed, hold a __________ weight in your hand. 3. Squeeze your shoulder blade toward the middle of your back. Do not let your shoulder lift toward your ear. 4. Keep your elbow bent in an "L" shape (90 degrees) while you slowly move your forearm up toward the ceiling. Move your forearm up to the height of the bed, toward your head. ? Your upper arm should not move. ? At the top of the movement, your palm should face the  floor. 5. Hold for __________ seconds. 6. Slowly return to the starting position and relax your muscles. Repeat __________ times. Complete this exercise __________ times a day. Exercise E: Scapular retraction and external rotation, rowing  1. Sit in a stable chair without armrests, or stand. 2. Secure an exercise band to a stable object in front of you so it is at shoulder height. 3. Hold one end of the exercise band in each hand. 4. Bring your arms out straight in front of you. 5. Step back, away from the secured end of the exercise band, until the band stretches. 6. Pull the band backward. As you do this, bend your elbows and squeeze your shoulder blades together, but avoid letting the rest of your body move. Do not let your shoulders lift up toward your ears. 7. Stop when your elbows are at your sides or slightly behind your body. 8. Hold for __________ seconds. 9. Slowly straighten your arms to return to the starting position. Repeat __________ times. Complete this exercise __________ times a day. Posture and body mechanics  Body mechanics refers to the movements and positions of your body while you do your daily activities. Posture is part of body mechanics. Good posture and healthy body mechanics can help to relieve stress in your body's tissues and joints. Good posture means that your spine is in its natural S-curve position (your spine is neutral), your shoulders are pulled back slightly, and your head is not tipped forward. The following are general guidelines for applying improved posture and body mechanics to your everyday activities. Standing   When standing, keep your spine neutral and your feet about hip-width apart. Keep a slight bend in your knees. Your ears, shoulders, and hips should line up.  When you do a task in which you lean forward while standing in one place for a long time, place one foot up on a stable object that is 2-4 inches (5-10 cm) high, such as a footstool.  This helps keep your spine neutral. Sitting   When sitting, keep your spine neutral and keep your feet flat on the floor. Use a footrest, if necessary, and keep your thighs parallel to the floor. Avoid rounding your shoulders, and avoid tilting your head forward.  When working at a desk or a computer, keep your desk at a height where your hands are slightly lower than your elbows. Slide your chair under your desk so you are close enough to maintain good posture.  When working at a computer, place your monitor at a height where you are looking straight ahead and you do not have to tilt your head forward or downward to look at the screen. Resting  When lying down and resting, avoid positions that are most painful for you.  If you have pain with activities such as sitting, bending, stooping, or squatting (flexion-based activities), lie in a position  in which your body does not bend very much. For example, avoid curling up on your side with your arms and knees near your chest (fetal position).  If you have pain with activities such as standing for a long time or reaching with your arms (extension-based activities), lie with your spine in a neutral position and bend your knees slightly. Try the following positions:  Lying on your side with a pillow between your knees.  Lying on your back with a pillow under your knees.  Lifting   When lifting objects, keep your feet at least shoulder-width apart and tighten your abdominal muscles.  Bend your knees and hips and keep your spine neutral. It is important to lift using the strength of your legs, not your back. Do not lock your knees straight out.  Always ask for help to lift heavy or awkward objects. This information is not intended to replace advice given to you by your health care provider. Make sure you discuss any questions you have with your health care provider. Document Released: 12/14/2005 Document Revised: 08/20/2016 Document Reviewed:  09/25/2015 Elsevier Interactive Patient Education  2018 [employer].   Thoracic Strain A thoracic strain, which is sometimes called a mid-back strain, is an injury to the muscles or tendons that attach to the upper part of your back behind your chest. This type of injury occurs when a muscle is overstretched or overloaded. Thoracic strains can range from mild to severe. Mild strains may involve stretching a muscle or tendon without tearing it. These injuries may heal in 1-2 weeks. More severe strains involve tearing of muscle fibers or tendons. These will cause more pain and may take 6-8 weeks to heal. What are the causes? This condition may be caused by:  An injury in which a sudden force is placed on the muscle.  Exercising without properly warming up.  Overuse of the muscle.  Improper form during certain movements.  Other injuries that surround or cause stress on the mid-back, causing a strain on the muscles.  In some cases, the cause may not be known. What increases the risk? This injury is more common in:  Athletes.  People with obesity.  What are the signs or symptoms? The main symptom of this condition is pain, especially with movement. Other symptoms include:  Bruising.  Swelling.  Spasm.  How is this diagnosed? This condition may be diagnosed with a physical exam. X-rays may be taken to check for a fracture. How is this treated? This condition may be treated with:  Resting and icing the injured area.  Physical therapy. This will involve doing stretching and strengthening exercises.  Medicines for pain and inflammation.  Follow these instructions at home:  Rest as needed. Follow instructions from your health care provider about any restrictions on activity.  If directed, apply ice to the injured area: ? Put ice in a plastic bag. ? Place a towel between your skin and the bag. ? Leave the ice on for 20 minutes, 2-3 times per day.  Take over-the-counter  and prescription medicines only as told by your health care provider.  Begin doing exercises as told by your health care provider or physical therapist.  Always warm up properly before physical activity or sports.  Bend your knees before you lift heavy objects.  Keep all follow-up visits as told by your health care provider. This is important. Contact a health care provider if:  Your pain is not helped by medicine.  Your pain, bruising, or  swelling is getting worse.  You have a fever. Get help right away if:  You have shortness of breath.  You have chest pain.  You develop numbness or weakness in your legs.  You have involuntary loss of urine (urinary incontinence). This information is not intended to replace advice given to you by your health care provider. Make sure you discuss any questions you have with your health care provider. Document Released: 03/05/2004 Document Revised: 08/15/2016 Document Reviewed: 02/07/2015 Elsevier Interactive Patient Education  2018 [employer].    IF you received an x-ray today, you will receive an invoice from [facility]. Please contact [facility] at [phone] with questions or concerns regarding your invoice.   IF you received labwork today, you will receive an invoice from [facility]. Please contact [facility] at [phone] with questions or concerns regarding your invoice.   Our billing staff will not be able to assist you with questions regarding bills from these companies.  You will be contacted with the lab results as soon as they are available. The fastest way to get your results is to activate your My Chart account. Instructions are located on the last page of this paperwork. If you have not heard from [place] regarding the results in 2 weeks, please contact this office.

## 2018-03-22 ENCOUNTER — Encounter: Payer: Self-pay | Admitting: Physician Assistant

## 2018-03-30 ENCOUNTER — Encounter: Payer: Self-pay | Admitting: Physician Assistant

## 2018-04-02 ENCOUNTER — Other Ambulatory Visit: Payer: Self-pay | Admitting: Physician Assistant

## 2018-04-02 DIAGNOSIS — Z889 Allergy status to unspecified drugs, medicaments and biological substances status: Secondary | ICD-10-CM

## 2018-04-04 NOTE — Telephone Encounter (Signed)
Olopatadine refill Last OV: 03/10/18 Last Refill:03/10/18 2.5 ml no refill Pharmacy:[facility]  PCP: Ivar Drape PA

## 2018-05-05 ENCOUNTER — Other Ambulatory Visit: Payer: Self-pay | Admitting: Internal Medicine

## 2018-05-05 DIAGNOSIS — Z8547 Personal history of malignant neoplasm of testis: Secondary | ICD-10-CM

## 2018-05-05 DIAGNOSIS — R102 Pelvic and perineal pain: Secondary | ICD-10-CM

## 2018-05-05 DIAGNOSIS — R300 Dysuria: Secondary | ICD-10-CM

## 2018-05-05 DIAGNOSIS — R748 Abnormal levels of other serum enzymes: Secondary | ICD-10-CM

## 2018-05-12 ENCOUNTER — Ambulatory Visit
Admission: RE | Admit: 2018-05-12 | Discharge: 2018-05-12 | Disposition: A | Discharge status: Home / Self Care | Payer: Self-pay | Source: Ambulatory Visit | Attending: Internal Medicine | Admitting: Internal Medicine

## 2018-05-12 DIAGNOSIS — R748 Abnormal levels of other serum enzymes: Secondary | ICD-10-CM

## 2018-05-12 DIAGNOSIS — R102 Pelvic and perineal pain: Secondary | ICD-10-CM

## 2018-05-12 DIAGNOSIS — R300 Dysuria: Secondary | ICD-10-CM

## 2018-05-12 DIAGNOSIS — Z8547 Personal history of malignant neoplasm of testis: Secondary | ICD-10-CM

## 2018-05-28 ENCOUNTER — Emergency Department (HOSPITAL_COMMUNITY)
Admission: EM | Admit: 2018-05-28 | Discharge: 2018-05-28 | Disposition: A | Discharge status: Home / Self Care | Payer: BLUE CROSS/BLUE SHIELD | Attending: Emergency Medicine | Admitting: Emergency Medicine

## 2018-05-28 ENCOUNTER — Other Ambulatory Visit: Payer: Self-pay

## 2018-05-28 ENCOUNTER — Encounter (HOSPITAL_COMMUNITY): Payer: Self-pay | Admitting: Emergency Medicine

## 2018-05-28 ENCOUNTER — Emergency Department (HOSPITAL_COMMUNITY): Payer: BLUE CROSS/BLUE SHIELD

## 2018-05-28 DIAGNOSIS — Y929 Unspecified place or not applicable: Secondary | ICD-10-CM | POA: Diagnosis not present

## 2018-05-28 DIAGNOSIS — Z87891 Personal history of nicotine dependence: Secondary | ICD-10-CM | POA: Diagnosis not present

## 2018-05-28 DIAGNOSIS — S91134A Puncture wound without foreign body of right lesser toe(s) without damage to nail, initial encounter: Secondary | ICD-10-CM | POA: Insufficient documentation

## 2018-05-28 DIAGNOSIS — W450XXA Nail entering through skin, initial encounter: Secondary | ICD-10-CM | POA: Diagnosis not present

## 2018-05-28 DIAGNOSIS — J4520 Mild intermittent asthma, uncomplicated: Secondary | ICD-10-CM | POA: Diagnosis not present

## 2018-05-28 DIAGNOSIS — Y999 Unspecified external cause status: Secondary | ICD-10-CM | POA: Insufficient documentation

## 2018-05-28 DIAGNOSIS — Y9301 Activity, walking, marching and hiking: Secondary | ICD-10-CM | POA: Diagnosis not present

## 2018-05-28 DIAGNOSIS — Z79899 Other long term (current) drug therapy: Secondary | ICD-10-CM | POA: Diagnosis not present

## 2018-05-28 DIAGNOSIS — S99921A Unspecified injury of right foot, initial encounter: Secondary | ICD-10-CM | POA: Diagnosis present

## 2018-05-28 MED ORDER — CIPROFLOXACIN HCL 500 MG PO TABS
500.0000 mg | ORAL_TABLET | Freq: Once | ORAL | Status: AC
  Administered 2018-05-28: 500 mg via ORAL
  Filled 2018-05-28: qty 1

## 2018-05-28 MED ORDER — CIPROFLOXACIN HCL 500 MG PO TABS: 500.0000 mg | ORAL_TABLET | Freq: Two times a day (BID) | ORAL | 0 refills | Status: DC

## 2018-05-28 NOTE — Discharge Instructions (Addendum)
Keep your foot clean.  Soak in soapy water several times a day.  Antibiotic as prescribed to prevent infection.  Ibuprofen or Tylenol for pain.  Follow-up as needed.

## 2018-05-28 NOTE — ED Notes (Signed)
Pt verbalized understanding discharge instructions and denies any further needs or questions at this time. VS stable, ambulatory and steady gait.   

## 2018-05-28 NOTE — ED Triage Notes (Signed)
Reports stepping on two rusty nails yesterday.  Removed them himself.  Had tetanus shot last year.

## 2018-05-28 NOTE — ED Provider Notes (Signed)
[facility] EMERGENCY DEPARTMENT Provider Note   CSN: [id number] Arrival date & time: 05/28/18  1949     History   Chief Complaint Chief Complaint  Patient presents with  . stepped on a nail    HPI William Farmer is a 37 y.o. male.  HPI William Farmer is a 37 y.o. male presents to emergency department with complaint of right foot injury.  Patient states that he stepped onto nails approximately 24 hours ago.  He states he has not been able to come in earlier.  He states he washed his foot and his wife put bacitracin and peroxide on it.  He states the nail did go through his shoes.  He reports pain with bearing weight on the foot.  No drainage.  No fever or chills.  No redness or swelling.  His tetanus is up-to-date.  Past Medical History:  Diagnosis Date  . Allergy   . testicular ca dx'd 08/2011   surg only    Patient Active Problem List   Diagnosis Date Noted  . Mild intermittent asthma 05/19/2017  . 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 Allergic rhinitis due to pollen 05/19/2017  . Testicular cancer ([place]) 06/28/2012    Past Surgical History:  Procedure Laterality Date  . testicular cancer surgery          Home Medications    Prior to Admission medications   Medication Sig Start Date End Date Taking? Authorizing Provider  cetirizine (ZYRTEC ALLERGY) 10 MG tablet Take 1 tablet (10 mg total) by mouth daily. 09/13/17   Ivar Drape D, PA  ciprofloxacin (CIPRO) 500 MG tablet Take 1 tablet (500 mg total) by mouth 2 (two) times daily. 05/28/18   Caelen Higinbotham, PA-C  cyclobenzaprine (FLEXERIL) 10 MG tablet Take 0.5-1 tablets (5-10 mg total) by mouth 3 (three) times daily as needed. 03/21/18   Ivar Drape D, PA  fluticasone (FLONASE) 50 MCG/ACT nasal spray Place 2 sprays into both nostrils daily. 03/10/18   Tereasa Coop, PA-C  meloxicam (MOBIC) 15 MG tablet Take 1 tablet (15 mg total) by mouth daily. 03/21/18    Ivar Drape D, PA  Olopatadine HCl 0.2 % SOLN INSTILL 1 DROP IN AFFECTED EYE(S) DAILY AS NEEDED 04/04/18   Joretta Bachelor, PA    Family History Family History  Problem Relation Age of Onset  . Diabetes Father   . Diabetes Sister     Social History Social History   Tobacco Use  . Smoking status: Former Research scientist (life sciences)  . Smokeless tobacco: Never Used  Substance Use Topics  . Alcohol use: No    Alcohol/week: 0.0 oz  . Drug use: No     Allergies   Pollen extract; Iodine; Alprazolam; Bee venom; Codeine; and Shrimp [shellfish allergy]   Review of Systems Review of Systems  Musculoskeletal: Positive for arthralgias and myalgias.  Skin: Positive for wound.  Neurological: Negative for weakness and numbness.  All other systems reviewed and are negative.    Physical Exam Updated Vital Signs BP 123/81 (BP Location: Right Arm)   Pulse 98   Temp 98.2 F (36.8 C) (Oral)   Resp 17   Ht 6' (1.829 m)   Wt 83 kg (183 lb)   SpO2 100%   BMI 24.82 kg/m   Physical Exam  Constitutional: He appears well-developed and well-nourished. No distress.  Eyes: Conjunctivae are normal.  Neck: Neck supple.  Cardiovascular: Normal rate.  Pulmonary/Chest: No respiratory  distress.  Abdominal: He exhibits no distension.  Skin: Skin is warm and dry.  2 small puncture wounds to the sole of the right foot.  There is slight erythema around the more proximal puncture wound with no drainage from either wound.  Both are tender to palpation.  Nursing note and vitals reviewed.    ED Treatments / Results  Labs (all labs ordered are listed, but only abnormal results are displayed) Labs Reviewed - No data to display  EKG None  Radiology Dg Foot Complete Right  Result Date: 05/28/2018 CLINICAL DATA:  Stepped on nails today yesterday with pain and swelling, initial encounter EXAM: RIGHT FOOT COMPLETE - 3+ VIEW COMPARISON:  06/25/2016 FINDINGS: No acute bony abnormality is noted. No gross  soft tissue abnormality is seen. No radiopaque foreign body is noted. IMPRESSION: No acute abnormality noted. Electronically Signed   By: Inez Catalina M.D.   On: 05/28/2018 20:37    Procedures Procedures (including critical care time)  Medications Ordered in ED Medications  ciprofloxacin (CIPRO) tablet 500 mg (has no administration in time range)     Initial Impression / Assessment and Plan / ED Course  I have reviewed the triage vital signs and the nursing notes.  Pertinent labs & imaging results that were available during my care of the patient were reviewed by me and considered in my medical decision making (see chart for details).     Patient in emergency department with 2 puncture wounds to the bottom of the foot.  Punctures her from a resting nail that went through his shoe.  This happened yesterday.  He cleaned the foot and applied peroxide and bacitracin.  At this time there is no obvious signs of infection however patient is very concerned.  We will start him on antibiotics prophylactically.  His tetanus is up-to-date.  Advised to soak at home.  X-rays negative.  Follow-up as needed  Vitals:   05/28/18 1959 05/28/18 2000 05/28/18 2207  BP: 133/87  123/81  Pulse: 82  98  Resp: 18  17  Temp: 97.8 F (36.6 C)  98.2 F (36.8 C)  TempSrc: Oral  Oral  SpO2: 98%  100%  Weight:  83 kg (183 lb)   Height:  6' (1.829 m)      Final Clinical Impressions(s) / ED Diagnoses   Final diagnoses:  Puncture wound of fifth toe of right foot, initial encounter    ED Discharge Orders        Ordered    ciprofloxacin (CIPRO) 500 MG tablet  2 times daily     05/28/18 2245       Jeannett Senior, PA-C 05/28/18 [place], Melvin, DO 05/29/18 0013

## 2018-06-26 ENCOUNTER — Encounter (HOSPITAL_COMMUNITY): Payer: Self-pay | Admitting: Emergency Medicine

## 2018-06-26 ENCOUNTER — Emergency Department (HOSPITAL_COMMUNITY)
Admission: EM | Admit: 2018-06-26 | Discharge: 2018-06-26 | Disposition: A | Discharge status: Home / Self Care | Payer: BLUE CROSS/BLUE SHIELD | Attending: Emergency Medicine | Admitting: Emergency Medicine

## 2018-06-26 DIAGNOSIS — J4520 Mild intermittent asthma, uncomplicated: Secondary | ICD-10-CM | POA: Diagnosis not present

## 2018-06-26 DIAGNOSIS — Z87891 Personal history of nicotine dependence: Secondary | ICD-10-CM | POA: Insufficient documentation

## 2018-06-26 DIAGNOSIS — Z79899 Other long term (current) drug therapy: Secondary | ICD-10-CM | POA: Diagnosis not present

## 2018-06-26 DIAGNOSIS — R2232 Localized swelling, mass and lump, left upper limb: Secondary | ICD-10-CM | POA: Diagnosis present

## 2018-06-26 DIAGNOSIS — M7989 Other specified soft tissue disorders: Secondary | ICD-10-CM

## 2018-06-26 NOTE — ED Notes (Signed)
Patient able to ambulate independently  

## 2018-06-26 NOTE — Discharge Instructions (Addendum)
You were seen in the emergency department today for likely insect bites vs. Bee sting.  It appears that you have a localized reaction.  We recommend taking Benadryl as well as Tylenol and/or Motrin per over-the-counter dosing instructions.  Please follow-up with your primary care provider in 3 days for reevaluation.  Return to the ER anytime for new or worsening symptoms including but not limited to worsening swelling, trouble breathing, feelings of your throat is closing, or any other concerns.

## 2018-06-26 NOTE — ED Provider Notes (Signed)
[facility] EMERGENCY DEPARTMENT Provider Note   CSN: [id number] Arrival date & time: 06/26/18  1914     History   Chief Complaint Chief Complaint  Patient presents with  . Hand Injury    HPI William Farmer is a 37 y.o. male with a hx of asthma who presents to the ED with complaints of L hand pain/swelling/itcing that started yesterday. Patient states that he was working outside and noticed a small bleeding area to the L hand, he subsequently realized he had 3 areas that appeared consistent with insect bite vs. Bee sting to the L hand, did not see or feel anything specifically bite him. No identifiable tic. He states he developed discomfort, swelling, and itchiness to this area. He feels the discomfort and itching somewhat radiates to the arm, swelling has stay localized to the knuckles. Took ibuprofen with some improvement in discomfort. Denies fever, chills, dyspnea, feeling of throat closing, numbness, or weakness.  Patient does have a history of allergy to bees, he has never required epinephrine for this. Denies traumatic injury.   HPI  Past Medical History:  Diagnosis Date  . Allergy   . testicular ca dx'd 08/2011   surg only    Patient Active Problem List   Diagnosis Date Noted  . Mild intermittent asthma 05/19/2017  . 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 Allergic rhinitis due to pollen 05/19/2017  . Testicular cancer ([place]) 06/28/2012    Past Surgical History:  Procedure Laterality Date  . testicular cancer surgery          Home Medications    Prior to Admission medications   Medication Sig Start Date End Date Taking? Authorizing Provider  cetirizine (ZYRTEC ALLERGY) 10 MG tablet Take 1 tablet (10 mg total) by mouth daily. 09/13/17   Ivar Drape D, PA  ciprofloxacin (CIPRO) 500 MG tablet Take 1 tablet (500 mg total) by mouth 2 (two) times daily. 05/28/18   Kirichenko, Tatyana, PA-C  cyclobenzaprine (FLEXERIL)  10 MG tablet Take 0.5-1 tablets (5-10 mg total) by mouth 3 (three) times daily as needed. 03/21/18   Ivar Drape D, PA  fluticasone (FLONASE) 50 MCG/ACT nasal spray Place 2 sprays into both nostrils daily. 03/10/18   Tereasa Coop, PA-C  meloxicam (MOBIC) 15 MG tablet Take 1 tablet (15 mg total) by mouth daily. 03/21/18   Ivar Drape D, PA  Olopatadine HCl 0.2 % SOLN INSTILL 1 DROP IN AFFECTED EYE(S) DAILY AS NEEDED 04/04/18   Joretta Bachelor, PA    Family History Family History  Problem Relation Age of Onset  . Diabetes Father   . Diabetes Sister     Social History Social History   Tobacco Use  . Smoking status: Former Research scientist (life sciences)  . Smokeless tobacco: Never Used  Substance Use Topics  . Alcohol use: No    Alcohol/week: 0.0 oz  . Drug use: No     Allergies   Pollen extract; Iodine; Alprazolam; Bee venom; Codeine; and Shrimp [shellfish allergy]   Review of Systems Review of Systems  Constitutional: Negative for chills and fever.  Musculoskeletal:       Positive for L hand pain/swelling/itching.   Skin: Positive for wound.  Neurological: Negative for weakness and numbness.    Physical Exam Updated Vital Signs BP 126/85   Pulse 74   Temp 98.2 F (36.8 C) (Oral)   Resp 16   Ht 6' (1.829 m)   Wt 83 kg (183  lb)   SpO2 100%   BMI 24.82 kg/m   Physical Exam  Constitutional: He appears well-developed and well-nourished. No distress.  HENT:  Head: Normocephalic and atraumatic.  Airway is patent. No posterior oropharyngeal swelling. Tolerating secretions without difficulty. No appreciable facial swelling   Eyes: Conjunctivae are normal. Right eye exhibits no discharge. Left eye exhibits no discharge.  Cardiovascular: Normal rate and regular rhythm.  Pulmonary/Chest: Effort normal and breath sounds normal. No stridor. No respiratory distress. He has no wheezes.  Musculoskeletal:  Patient has 3 punctate wounds to the left hand, one to dorsum of 4th  proximal phalanx, one to dorsum of 3rd proximal phalanx, and one to the palm just proximal to 3rd MCP. No obvious foreign bodies.  There is no overlying erythema or warmth.  Patient's MCPs and proximal phalanx appear mildly swollen, swelling is well localized, does not extend to the hand or wrist or distal phalanxes.  No urticaria.  No vesicles.  No induration or fluctuance.  Patient has normal range of motion to bilateral wrists and to all digits (MCPs/PIP/DIPs).  Mildly tender over areas of wounds.  Neurological: He is alert.  Clear speech.  5 out of 5 symmetric grip strength.  Sensation grossly intact bilateral upper extremities.  Patient able to perform okay sign, thumbs up, and cross second and third digits bilaterally.  Psychiatric: He has a normal mood and affect. His behavior is normal. Thought content normal.  Nursing note and vitals reviewed.    ED Treatments / Results  Labs (all labs ordered are listed, but only abnormal results are displayed) Labs Reviewed - No data to display  EKG None  Radiology No results found.  Procedures Procedures (including critical care time)  Medications Ordered in ED Medications - No data to display   Initial Impression / Assessment and Plan / ED Course  I have reviewed the triage vital signs and the nursing notes.  Pertinent labs & imaging results that were available during my care of the patient were reviewed by me and considered in my medical decision making (see chart for details).   Patient presents with likely insect bite/bee stings and localized reaction. No evidence of respiratory distress, tolerating secretions without difficulty, no stridor, airway is patent. Reaction appears well localized. No evidence of superimposed infection or obvious foreign bodies. Recommended motrin/benadryl. I discussed treatment plan, need for PCP follow-up, and return precautions with the patient. Provided opportunity for questions, patient confirmed  understanding and is in agreement with plan.    Final Clinical Impressions(s) / ED Diagnoses   Final diagnoses:  Swelling of left hand    ED Discharge Orders    None       Amaryllis Dyke, PA-C 06/27/18 0053    Davonna Belling, MD 06/27/18 [phone]

## 2018-06-26 NOTE — ED Triage Notes (Signed)
Pt c/o L hand swelling and pain onset yesterday after working in the yard. Minor swelling noted.

## 2018-06-26 NOTE — ED Notes (Signed)
Pt changed in to gown due to c/o itching to left arm

## 2018-09-22 ENCOUNTER — Other Ambulatory Visit: Payer: Self-pay | Admitting: Physician Assistant

## 2018-09-22 MED ORDER — CETIRIZINE HCL 10 MG PO TABS: 10.0000 mg | ORAL_TABLET | Freq: Every day | ORAL | 0 refills | Status: DC

## 2018-09-22 NOTE — Telephone Encounter (Signed)
Copied from [place] [id number]. Topic: Quick Communication - Rx Refill/Question >> Sep 22, 2018  4:52 PM Keene Breath wrote: Medication: cetirizine (ZYRTEC ALLERGY) 10 MG tablet  Patient called to request a refill for the above medication.  CB# [phone]  Preferred Pharmacy (with phone number or street name): [facility] [place], [place] [phone] (Phone) [phone] (Fax)

## 2018-09-22 NOTE — Telephone Encounter (Signed)
Zyrtec allergy refill Last refill:09/13/17 (expired) Last OV:03/10/18 WTG:RMBO-BO establish w/[facility] 10/10/18 Pharmacy: [facility], [place] [phone] (Phone) [phone] (Fax)

## 2018-09-22 NOTE — Telephone Encounter (Signed)
Refill request for cetirizine 10 mg #30 with 0 refills approved.  Pt has upcoming appt on 10/10/18 with [facility] to establish care. Dgaddy, CMA

## 2018-09-26 ENCOUNTER — Ambulatory Visit: Payer: Self-pay | Admitting: Family Medicine

## 2018-10-10 ENCOUNTER — Ambulatory Visit: Payer: BLUE CROSS/BLUE SHIELD | Admitting: Physician Assistant

## 2018-10-10 ENCOUNTER — Other Ambulatory Visit: Payer: Self-pay

## 2018-10-10 ENCOUNTER — Encounter: Payer: Self-pay | Admitting: Physician Assistant

## 2018-10-10 VITALS — BP 133/81 | HR 82 | Temp 98.1°F | Resp 18 | Ht 72.52 in | Wt 181.0 lb

## 2018-10-10 DIAGNOSIS — J302 Other seasonal allergic rhinitis: Secondary | ICD-10-CM

## 2018-10-10 DIAGNOSIS — Z7689 Persons encountering health services in other specified circumstances: Secondary | ICD-10-CM

## 2018-10-10 MED ORDER — FLUTICASONE PROPIONATE 50 MCG/ACT NA SUSP: 2.0000 | Freq: Every day | NASAL | 12 refills | Status: DC

## 2018-10-10 MED ORDER — OLOPATADINE HCL 0.2 % OP SOLN: OPHTHALMIC | 5 refills | Status: DC

## 2018-10-10 MED ORDER — CETIRIZINE HCL 10 MG PO TABS: 10.0000 mg | ORAL_TABLET | Freq: Every day | ORAL | 3 refills | Status: AC

## 2018-10-10 NOTE — Patient Instructions (Addendum)
Start zyrtec, flonase, and eye drops daily. Use over the counter nasal saline rinse before flonase. Thank you for letting me participate in your health and well being.   If you have lab work done today you will be contacted with your lab results within the next 2 weeks.  If you have not heard from [place] then please contact us. The fastest way to get your results is to register for My Chart. Allergic Rhinitis, Adult Allergic rhinitis is an allergic reaction that affects the mucous membrane inside the nose. It causes sneezing, a runny or stuffy nose, and the feeling of mucus going down the back of the throat (postnasal drip). Allergic rhinitis can be mild to severe. There are two types of allergic rhinitis:  Seasonal. This type is also called hay fever. It happens only during certain seasons.  Perennial. This type can happen at any time of the year.  What are the causes? This condition happens when the body's defense system (immune system) responds to certain harmless substances called allergens as though they were germs.  Seasonal allergic rhinitis is triggered by pollen, which can come from grasses, trees, and weeds. Perennial allergic rhinitis may be caused by:  House dust mites.  Pet dander.  Mold spores.  What are the signs or symptoms? Symptoms of this condition include:  Sneezing.  Runny or stuffy nose (nasal congestion).  Postnasal drip.  Itchy nose.  Tearing of the eyes.  Trouble sleeping.  Daytime sleepiness.  How is this diagnosed? This condition may be diagnosed based on:  Your medical history.  A physical exam.  Tests to check for related conditions, such as: ? Asthma. ? Pink eye. ? Ear infection. ? Upper respiratory infection.  Tests to find out which allergens trigger your symptoms. These may include skin or blood tests.  How is this treated? There is no cure for this condition, but treatment can help control symptoms. Treatment may  include:  Taking medicines that block allergy symptoms, such as antihistamines. Medicine may be given as a shot, nasal spray, or pill.  Avoiding the allergen.  Desensitization. This treatment involves getting ongoing shots until your body becomes less sensitive to the allergen. This treatment may be done if other treatments do not help.  If taking medicine and avoiding the allergen does not work, new, stronger medicines may be prescribed.  Follow these instructions at home:  Find out what you are allergic to. Common allergens include smoke, dust, and pollen.  Avoid the things you are allergic to. These are some things you can do to help avoid allergens: ? Replace carpet with wood, tile, or vinyl flooring. Carpet can trap dander and dust. ? Do not smoke. Do not allow smoking in your home. ? Change your heating and air conditioning filter at least once a month. ? During allergy season:  Keep windows closed as much as possible.  Plan outdoor activities when pollen counts are lowest. This is usually during the evening hours.  When coming indoors, change clothing and shower before sitting on furniture or bedding.  Take over-the-counter and prescription medicines only as told by your health care provider.  Keep all follow-up visits as told by your health care provider. This is important. Contact a health care provider if:  You have a fever.  You develop a persistent cough.  You make whistling sounds when you breathe (you wheeze).  Your symptoms interfere with your normal daily activities. Get help right away if:  You have shortness of breath.  Summary  This condition can be managed by taking medicines as directed and avoiding allergens.  Contact your health care provider if you develop a persistent cough or fever.  During allergy season, keep windows closed as much as possible. This information is not intended to replace advice given to you by your health care provider. Make  sure you discuss any questions you have with your health care provider. Document Released: 09/08/2001 Document Revised: 01/21/2017 Document Reviewed: 01/21/2017 Elsevier Interactive Patient Education  2018 [employer].   IF you received an x-ray today, you will receive an invoice from [facility] Radiology. Please contact [facility] Radiology at [phone] with questions or concerns regarding your invoice.   IF you received labwork today, you will receive an invoice from [facility]. Please contact [facility] at [phone] with questions or concerns regarding your invoice.   Our billing staff will not be able to assist you with questions regarding bills from these companies.  You will be contacted with the lab results as soon as they are available. The fastest way to get your results is to activate your My Chart account. Instructions are located on the last page of this paperwork. If you have not heard from [place] regarding the results in 2 weeks, please contact this office.

## 2018-10-10 NOTE — Progress Notes (Signed)
MRN: [id number] DOB: 1981/08/15  Subjective:   William Farmer is a 37 y.o. male presenting for chief complaint of Establish Care; Sinus Problem; and Medication Refill (Olopatadine) . Needs refill for medication for seasonal allergies. Dx age 40. Takes zyrtec and olopatadine eye drops. Was out of zyrtec for 3 weeks and noticed a huge difference. Was having lots of sneezing, itchy watery eyes, and nasal congestion.  Bought it OTC and has been helping. Has been out of eye drops for a month and eyes feel terrible. Uses flonase infrequently but does note it helps. Nasal mucous is whitish clear.  Denies fever, sinus headache, sinus pain, ear pain, ear drainage, wheezing, shortness of breath, chest tightness, chest pain, myalgia and cough.  Denies smoking. Denies any other aggravating or relieving factors, no other questions or concerns.  William Farmer has a current medication list which includes the following prescription(s): cetirizine, cyclobenzaprine, fluticasone, meloxicam, ciprofloxacin, and olopatadine hcl. Also is allergic to pollen extract; iodine; alprazolam; bee venom; codeine; and shrimp [shellfish allergy].  William Farmer  has a past medical history of Allergy and testicular ca (dx'd 08/2011). Also  has a past surgical history that includes testicular cancer surgery.   Objective:   Vitals: BP 133/81   Pulse 82   Temp 98.1 F (36.7 C) (Oral)   Resp 18   Ht 6' 0.52" (1.842 m)   Wt 181 lb (82.1 kg)   SpO2 98%   BMI 24.20 kg/m   Physical Exam  Constitutional: He is oriented to person, place, and time. He appears well-developed and well-nourished. No distress.  HENT:  Head: Normocephalic and atraumatic.  Right Ear: External ear and ear canal normal. Tympanic membrane is not erythematous and not bulging. A middle ear effusion is present.  Left Ear: External ear and ear canal normal. Tympanic membrane is not erythematous and not bulging. A middle ear effusion is present.  Nose: Mucosal edema  (L>R) present. No rhinorrhea. Right sinus exhibits no maxillary sinus tenderness and no frontal sinus tenderness. Left sinus exhibits no maxillary sinus tenderness and no frontal sinus tenderness.  Mouth/Throat: Uvula is midline and mucous membranes are normal. Posterior oropharyngeal erythema present. Tonsils are 1+ on the right. Tonsils are 1+ on the left. No tonsillar exudate.  Eyes: Right eye exhibits discharge (watery). Left eye exhibits discharge (watery). Right conjunctiva is injected (mild). Left conjunctiva is injected (mild).  Neck: Normal range of motion.  Pulmonary/Chest: Effort normal and breath sounds normal. He has no wheezes. He has no rhonchi. He has no rales.  Neurological: He is alert and oriented to person, place, and time.  Skin: Skin is warm and dry.  Psychiatric: He has a normal mood and affect.  Vitals reviewed.   No results found for this or any previous visit (from the past 24 hour(s)).  Assessment and Plan :  1. Seasonal allergies Uncontrolled. No concern for infectious etiology at this time. Suggest restarting flonase and daily nasal saline rinses, along with zyrtec and eye drops.  - cetirizine (ZYRTEC ALLERGY) 10 MG tablet; Take 1 tablet (10 mg total) by mouth daily.  Dispense: 90 tablet; Refill: 3 - fluticasone (FLONASE) 50 MCG/ACT nasal spray; Place 2 sprays into both nostrils daily.  Dispense: 16 g; Refill: 12 - Olopatadine HCl 0.2 % SOLN; INSTILL 1 DROP IN AFFECTED EYE(S) DAILY AS NEEDED  Dispense: 2.5 mL; Refill: 5  2. Encounter to establish care I am happy to take over pt's care.   Tenna Delaine, PA-C  [facility] 10/10/2018 11:17 AM

## 2018-10-27 ENCOUNTER — Ambulatory Visit: Payer: Self-pay

## 2018-10-27 ENCOUNTER — Other Ambulatory Visit: Payer: Self-pay | Admitting: Family Medicine

## 2018-10-27 DIAGNOSIS — M25552 Pain in left hip: Secondary | ICD-10-CM

## 2018-10-27 DIAGNOSIS — M545 Low back pain, unspecified: Secondary | ICD-10-CM

## 2019-01-29 IMAGING — DX DG HIP (WITH OR WITHOUT PELVIS) 2-3V*L*
3 series · 3 of 3 positions shown · non-contrast
Comparison: None.

CLINICAL DATA: Left hip pain for 4 months.

EXAM:
DG HIP (WITH OR WITHOUT PELVIS) 2-3V LEFT

[pelvis ap]
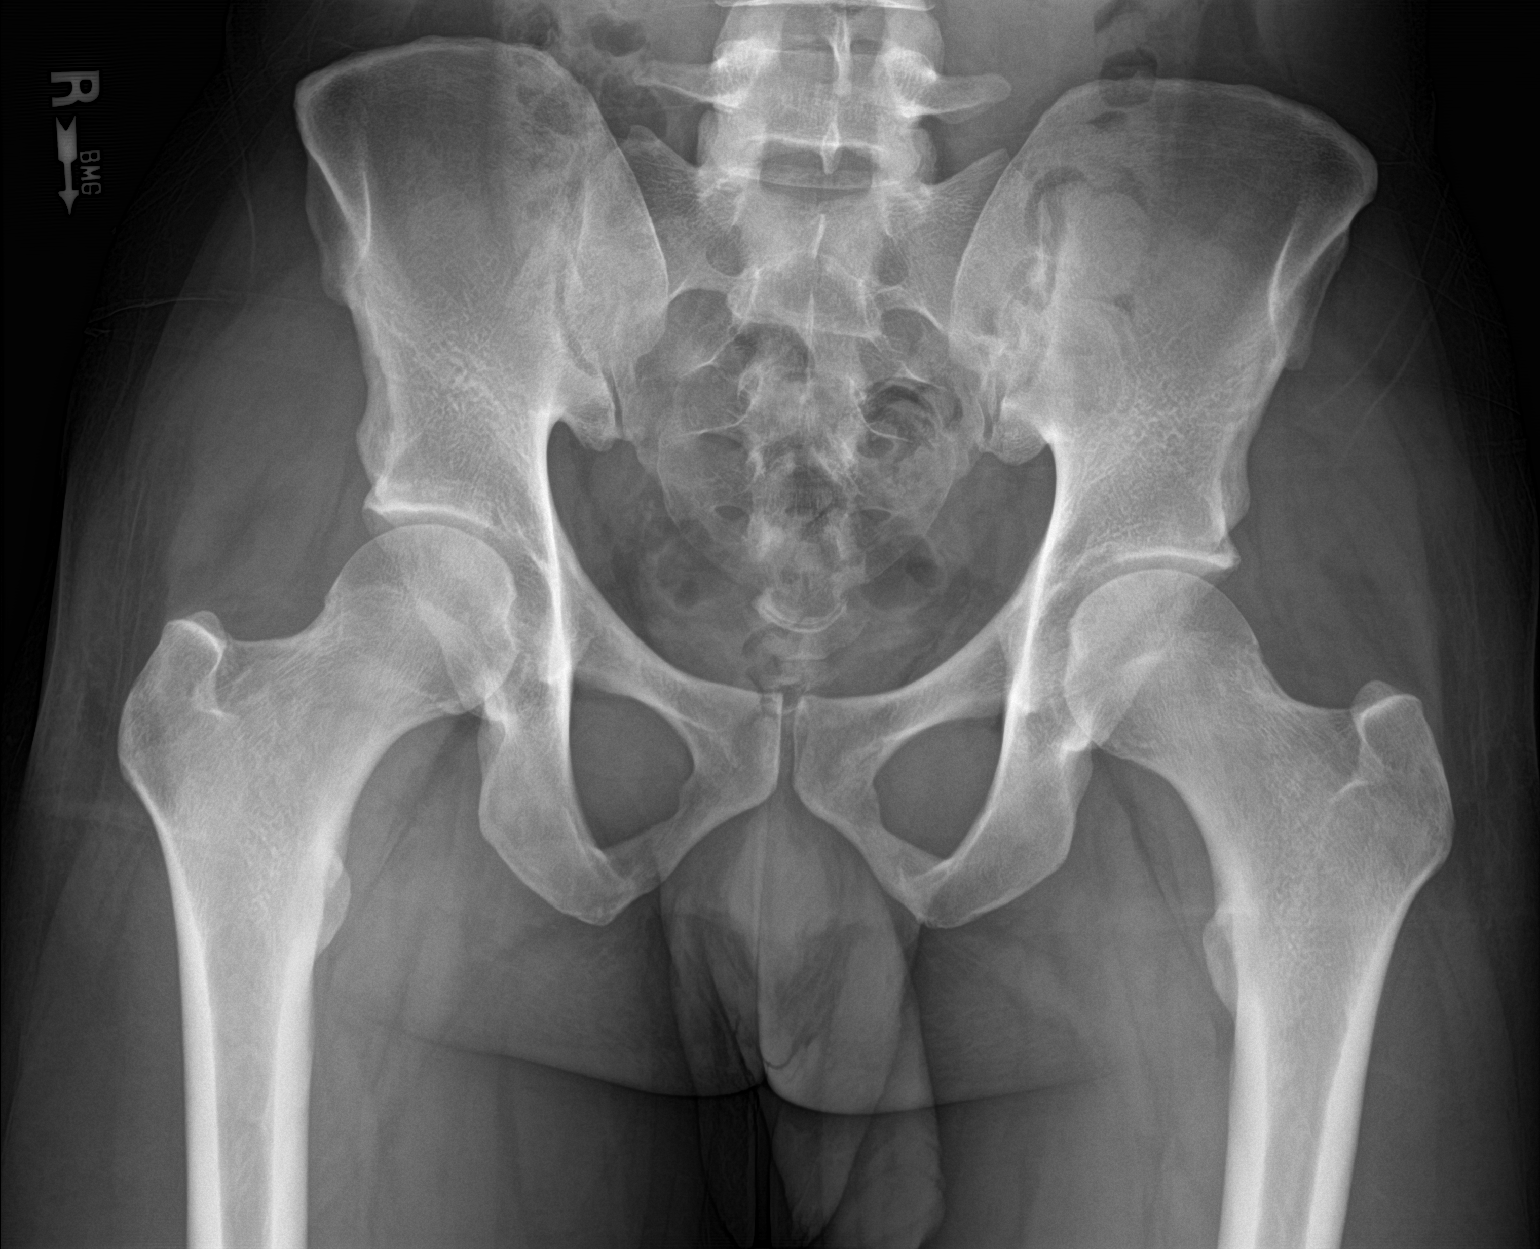

[hip ap]
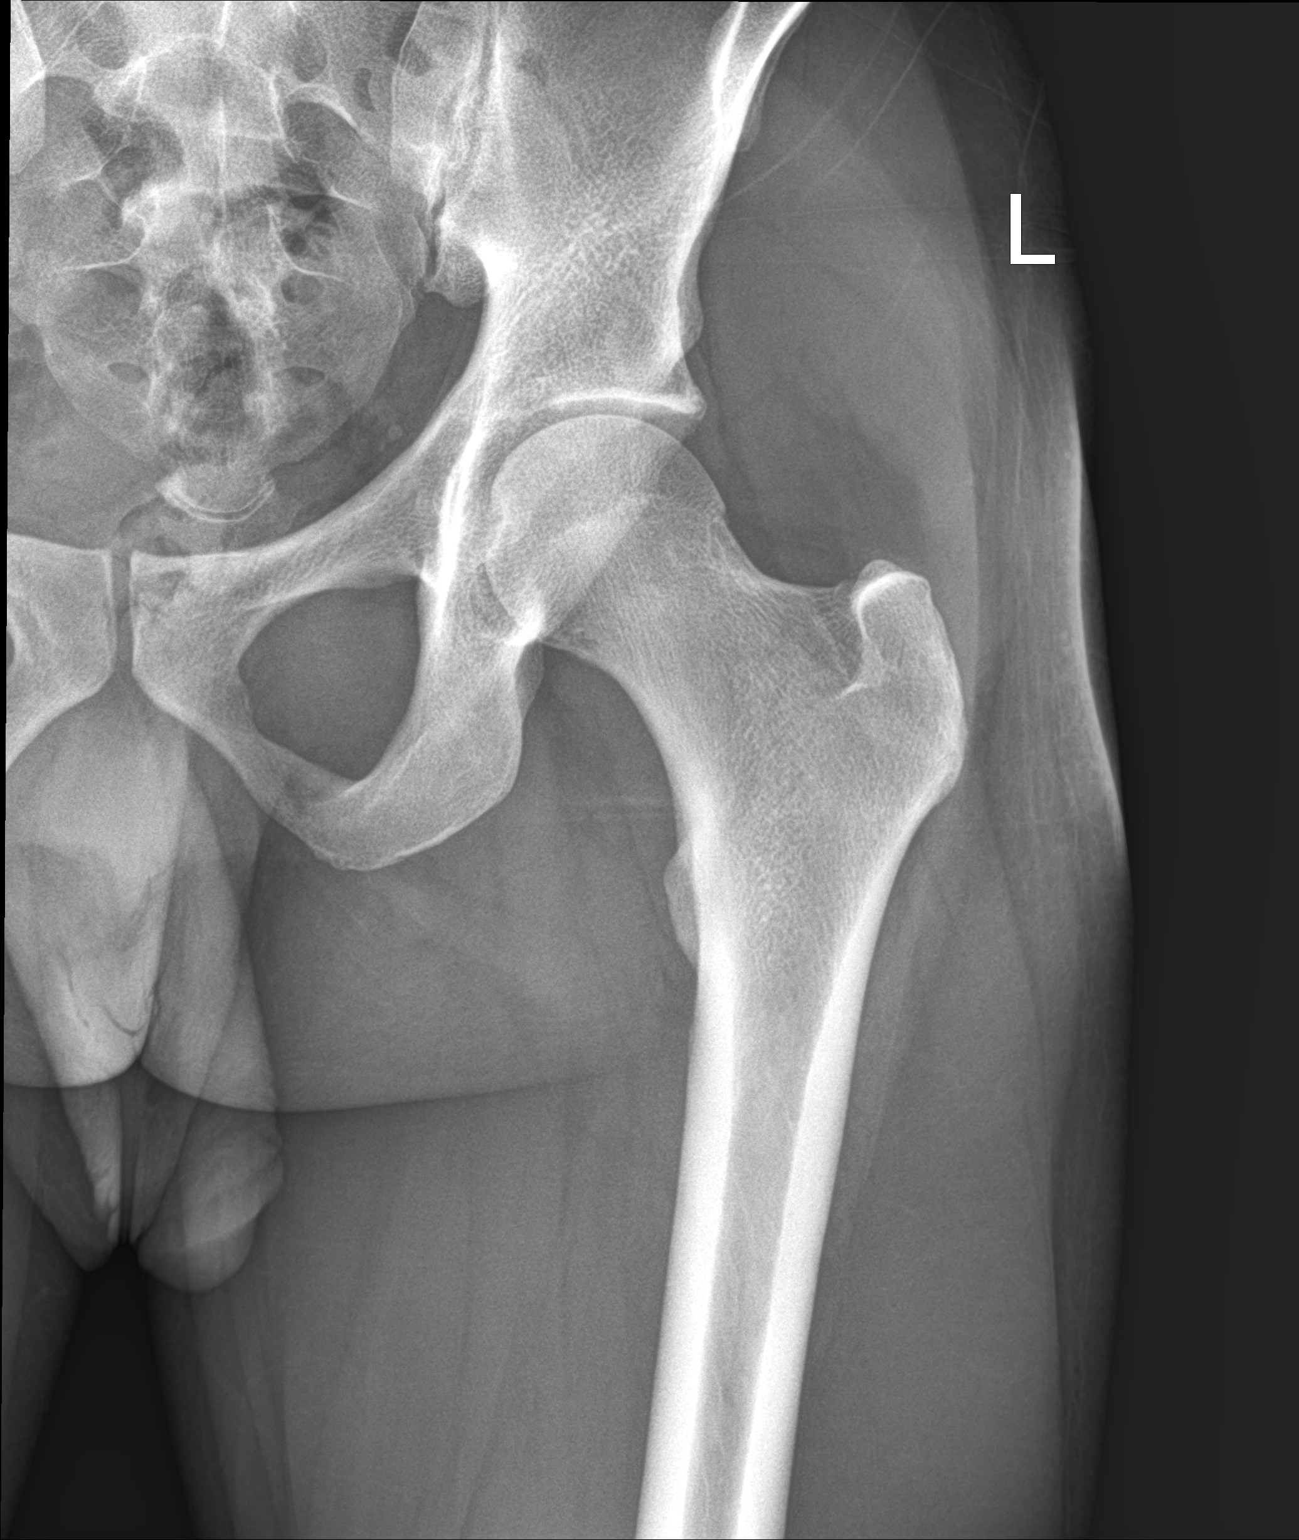

[hip lat]
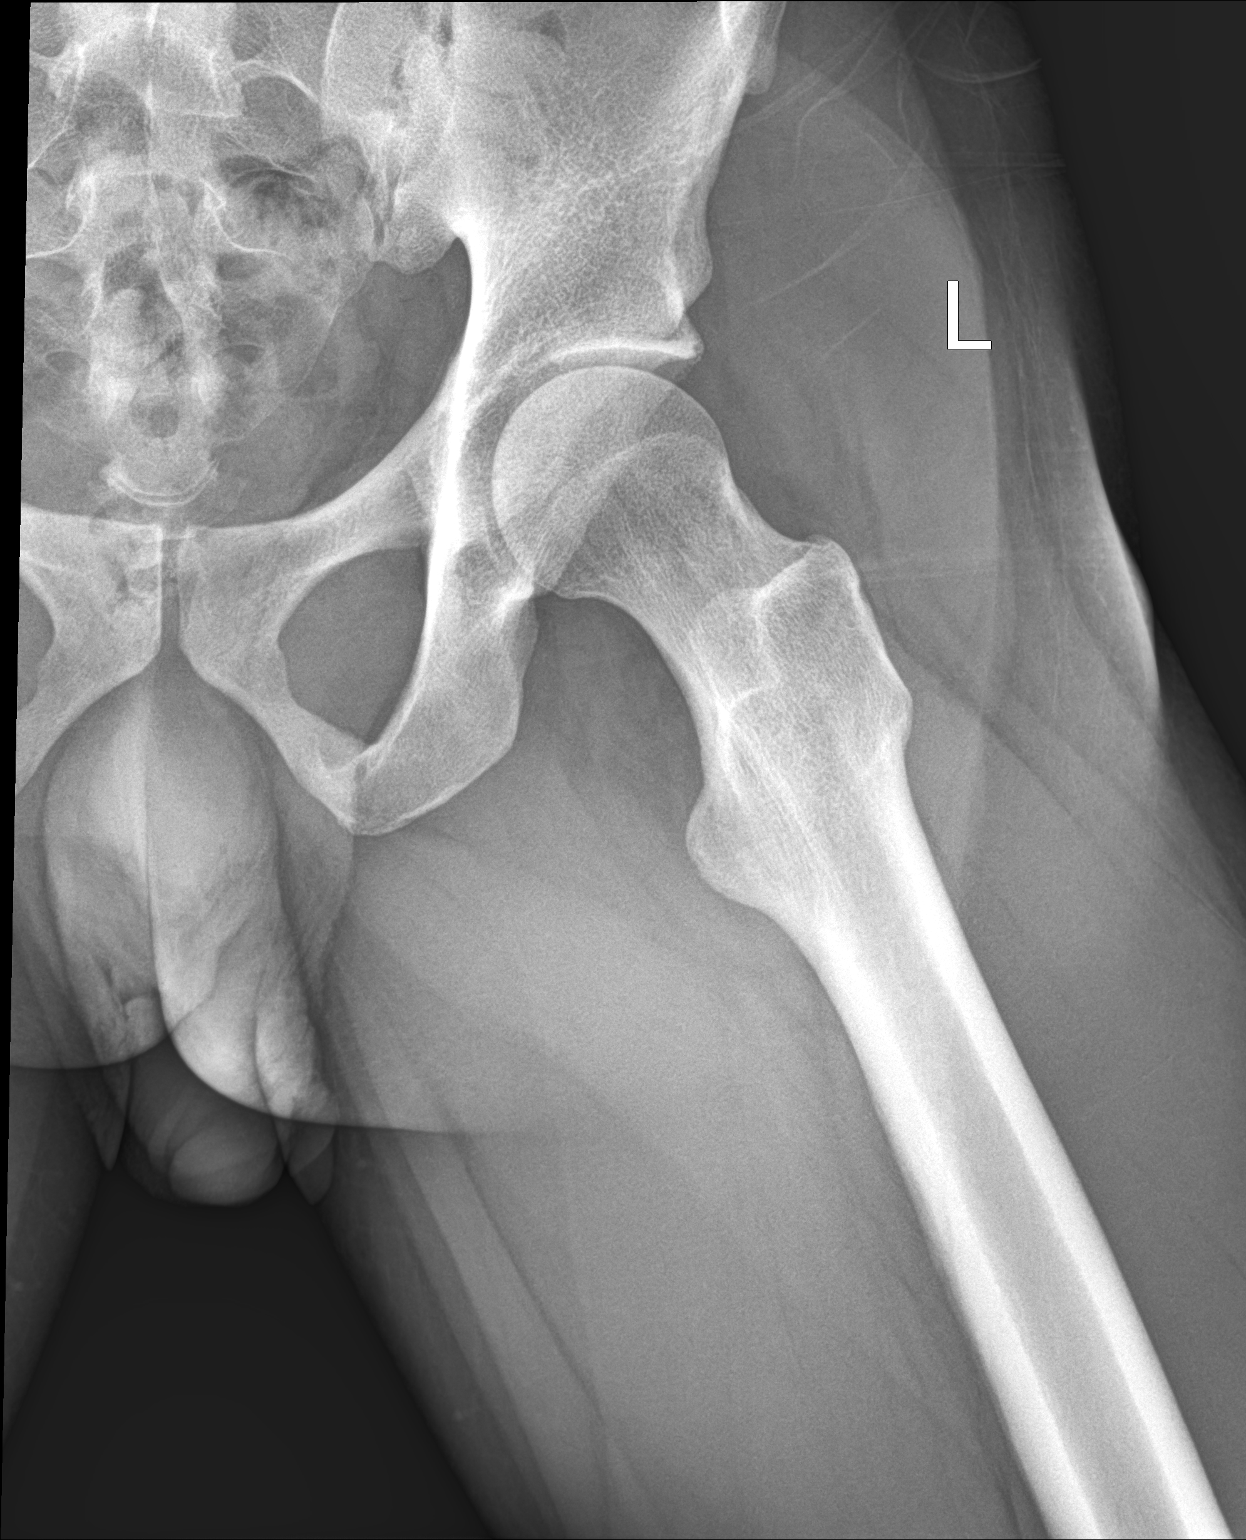

[3 of 3 positions shown; findings below may reference images not displayed]

FINDINGS: There is no evidence of hip fracture or dislocation. There is no
evidence of arthropathy or other focal bone abnormality.
IMPRESSION: Normal left hip.

## 2019-12-22 IMAGING — DX DG FOOT COMPLETE 3+V*R*
3 series · 3 of 3 positions shown · non-contrast
Comparison: 06/25/2016

CLINICAL DATA: Stepped on nails today yesterday with pain and
swelling, initial encounter

EXAM:
RIGHT FOOT COMPLETE - 3+ VIEW

[foot ap]
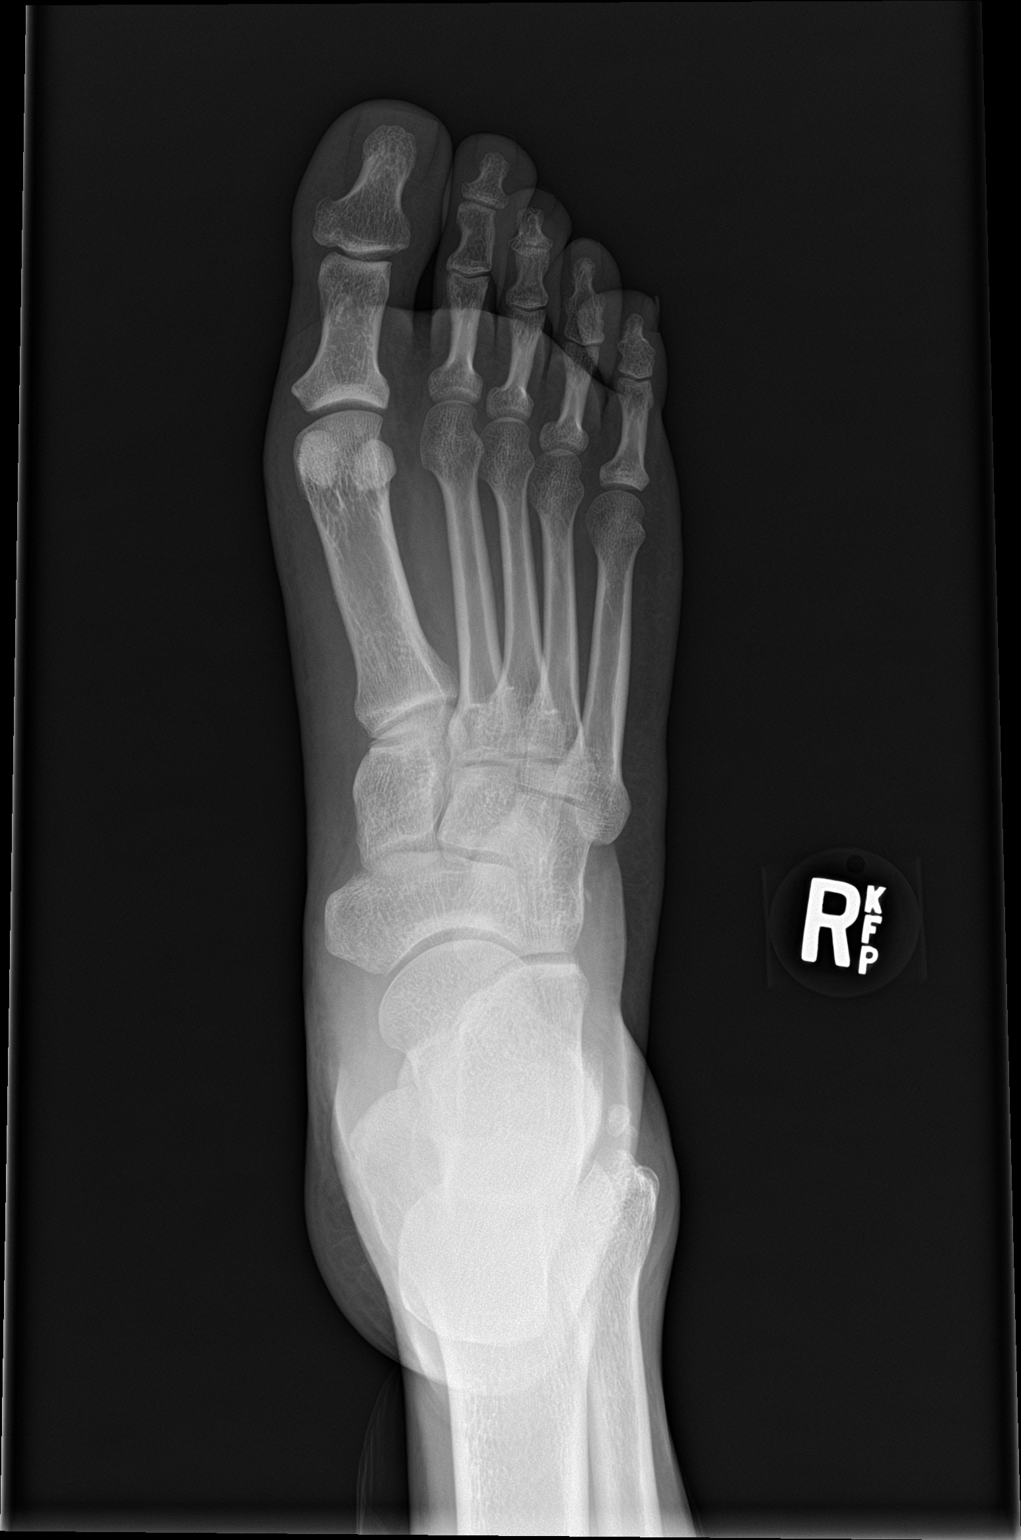

[foot obl]
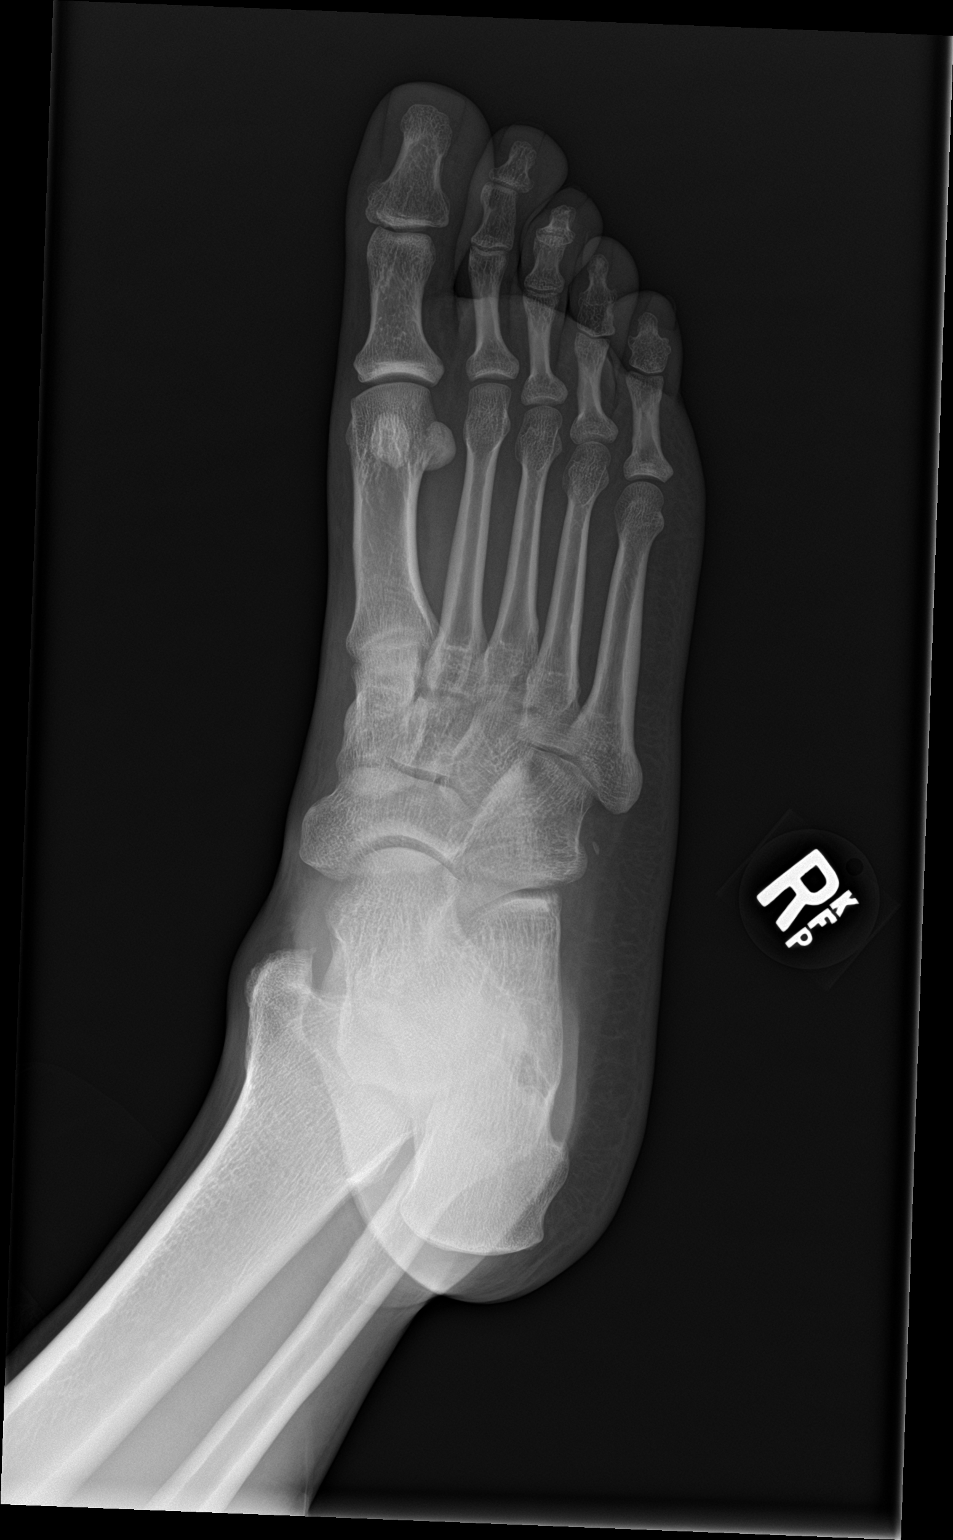

[foot lat]
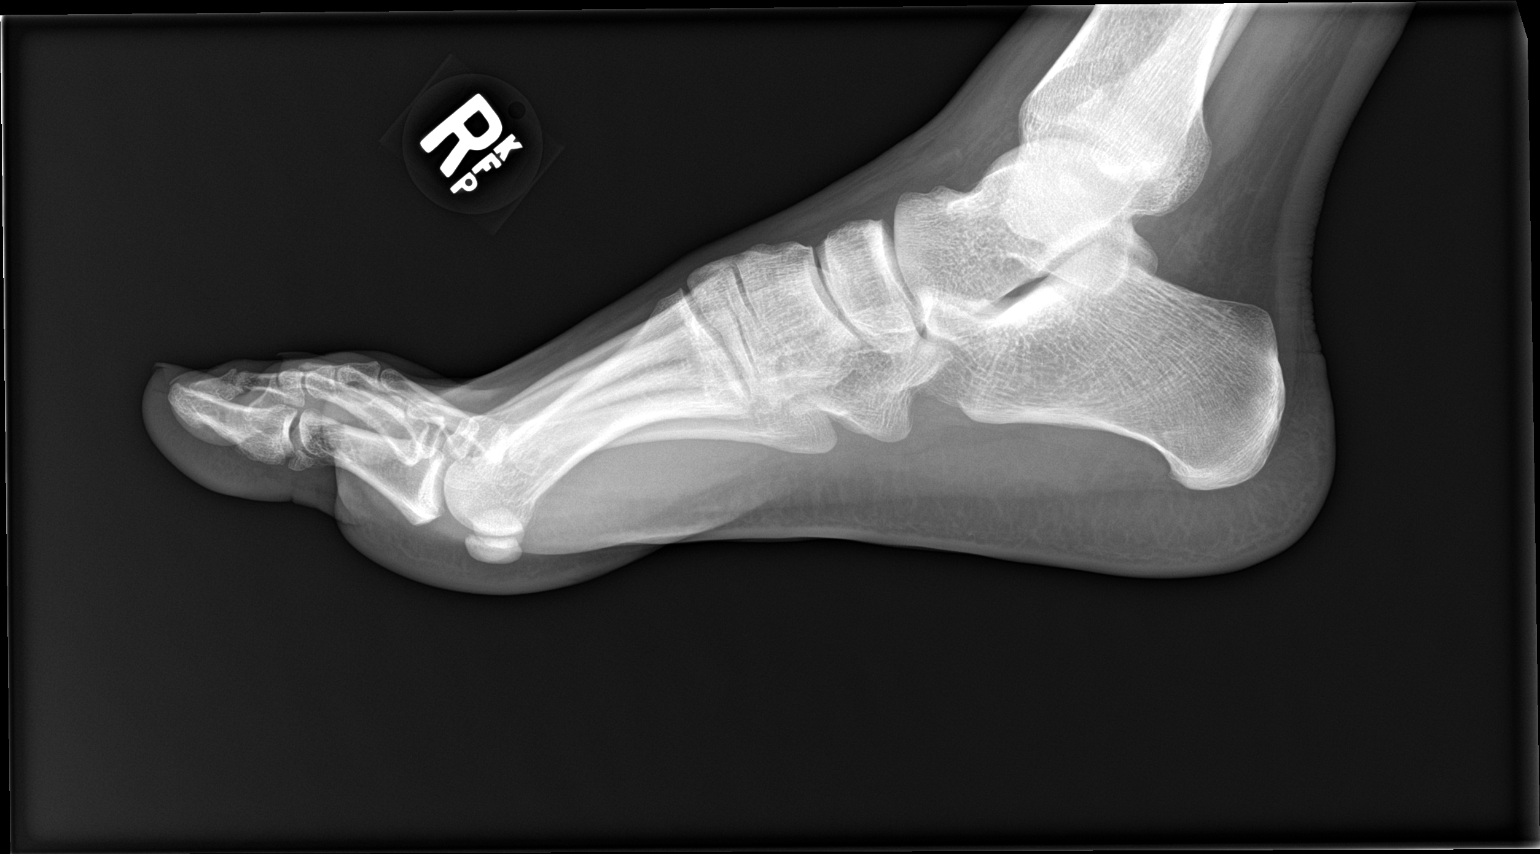

[3 of 3 positions shown; findings below may reference images not displayed]

FINDINGS: No acute bony abnormality is noted. No gross soft tissue abnormality
is seen. No radiopaque foreign body is noted.
IMPRESSION: No acute abnormality noted.

## 2020-05-07 ENCOUNTER — Encounter: Payer: Self-pay | Admitting: Adult Health Nurse Practitioner

## 2020-05-08 ENCOUNTER — Encounter (HOSPITAL_COMMUNITY): Payer: Self-pay | Admitting: Emergency Medicine

## 2020-05-08 ENCOUNTER — Emergency Department (HOSPITAL_COMMUNITY)
Admission: EM | Admit: 2020-05-08 | Discharge: 2020-05-08 | Disposition: A | Discharge status: Home / Self Care | Payer: No Typology Code available for payment source | Attending: Emergency Medicine | Admitting: Emergency Medicine

## 2020-05-08 ENCOUNTER — Emergency Department (HOSPITAL_COMMUNITY): Payer: No Typology Code available for payment source

## 2020-05-08 ENCOUNTER — Other Ambulatory Visit: Payer: Self-pay

## 2020-05-08 DIAGNOSIS — Z87891 Personal history of nicotine dependence: Secondary | ICD-10-CM | POA: Insufficient documentation

## 2020-05-08 DIAGNOSIS — M546 Pain in thoracic spine: Secondary | ICD-10-CM | POA: Insufficient documentation

## 2020-05-08 DIAGNOSIS — M62830 Muscle spasm of back: Secondary | ICD-10-CM | POA: Insufficient documentation

## 2020-05-08 HISTORY — DX: Dorsalgia, unspecified: M54.9

## 2020-05-08 MED ORDER — METHOCARBAMOL 500 MG PO TABS
1000.0000 mg | ORAL_TABLET | Freq: Once | ORAL | Status: AC
  Administered 2020-05-08: 1000 mg via ORAL
  Filled 2020-05-08: qty 2

## 2020-05-08 MED ORDER — HYDROCODONE-ACETAMINOPHEN 5-325 MG PO TABS
1.0000 | ORAL_TABLET | Freq: Once | ORAL | Status: AC
  Administered 2020-05-08: 1 via ORAL
  Filled 2020-05-08: qty 1

## 2020-05-08 MED ORDER — CYCLOBENZAPRINE HCL 10 MG PO TABS: 10.0000 mg | ORAL_TABLET | Freq: Three times a day (TID) | ORAL | 0 refills | Status: DC | PRN

## 2020-05-08 MED ORDER — KETOROLAC TROMETHAMINE 60 MG/2ML IM SOLN
60.0000 mg | Freq: Once | INTRAMUSCULAR | Status: AC
  Administered 2020-05-08: 60 mg via INTRAMUSCULAR
  Filled 2020-05-08: qty 2

## 2020-05-08 NOTE — ED Provider Notes (Signed)
[facility] EMERGENCY DEPARTMENT Provider Note   CSN: [id number] Arrival date & time: 05/08/20  1251     History Chief Complaint  Patient presents with  . Back Pain    William Farmer is a 39 y.o. male.  39 y/o male with his of chronic back pain after work injury for over one year. Reports acute on chronic pain of the bilateral thoracic spine worsening over the week. Denies numbness, tingling, weakness, saddle anesthesia, loss of control of bowel or bladder movements. Taking tylenol and motrin with no benefit. Pain worse with movement, no recent injury or trauma.         Past Medical History:  Diagnosis Date  . Allergy   . Back pain   . testicular ca dx'd 08/2011   surg only    Patient Active Problem List   Diagnosis Date Noted  . Mild intermittent asthma 05/19/2017  . 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 Allergic rhinitis due to pollen 05/19/2017  . Testicular cancer ([place]) 06/28/2012    Past Surgical History:  Procedure Laterality Date  . testicular cancer surgery         Family History  Problem Relation Age of Onset  . Diabetes Father   . Diabetes Sister     Social History   Tobacco Use  . Smoking status: Former Smoker    Types: Cigarettes  . Smokeless tobacco: Never Used  Substance Use Topics  . Alcohol use: No    Alcohol/week: 0.0 standard drinks  . Drug use: No    Home Medications Prior to Admission medications   Medication Sig Start Date End Date Taking? Authorizing Provider  cetirizine (ZYRTEC ALLERGY) 10 MG tablet Take 1 tablet (10 mg total) by mouth daily. 10/10/18   Tenna Delaine D, PA-C  cyclobenzaprine (FLEXERIL) 10 MG tablet Take 1 tablet (10 mg total) by mouth 3 (three) times daily as needed for up to 21 days. 05/08/20 05/29/20  Madilyn Hook A, PA-C  fluticasone (FLONASE) 50 MCG/ACT nasal spray Place 2 sprays into both nostrils daily. 10/10/18   Tenna Delaine D, PA-C  Olopatadine  HCl 0.2 % SOLN INSTILL 1 DROP IN AFFECTED EYE(S) DAILY AS NEEDED 10/10/18   Tenna Delaine D, PA-C  traMADol (ULTRAM) 50 MG tablet TK 1 T PO Q 6 H PRF BREAKTHROUGH PAIN 07/28/18   [provider]    Allergies    Pollen extract, Alprazolam, Bee venom, Codeine, and Shrimp [shellfish allergy]  Review of Systems   Review of Systems  Constitutional: Negative for chills and fever.  Respiratory: Negative for cough and shortness of breath.   Genitourinary: Negative for decreased urine volume and difficulty urinating.  Musculoskeletal: Positive for back pain. Negative for gait problem.  Skin: Negative for rash and wound.  Neurological: Negative for numbness.  Hematological: Does not bruise/bleed easily.    Physical Exam Updated Vital Signs BP 113/88 (BP Location: Right Arm)   Pulse 88   Temp 98.1 F (36.7 C) (Oral)   Resp 18   Ht 6' (1.829 m)   Wt 88 kg   SpO2 98%   BMI 26.31 kg/m   Physical Exam Vitals and nursing note reviewed.  Constitutional:      General: He is not in acute distress.    Appearance: Normal appearance. He is not ill-appearing, toxic-appearing or diaphoretic.     Comments: Appears uncomfortable and in pain  HENT:     Head: Normocephalic.  Eyes:  Conjunctiva/sclera: Conjunctivae normal.  Pulmonary:     Effort: Pulmonary effort is normal.  Musculoskeletal:     Comments: Bilateral paraspinal thoracic back pain with palpable spasms. No bony tenderness  Skin:    General: Skin is dry.  Neurological:     General: No focal deficit present.     Mental Status: He is alert.     Sensory: No sensory deficit.     Motor: No weakness.  Psychiatric:        Mood and Affect: Mood normal.     ED Results / Procedures / Treatments   Labs (all labs ordered are listed, but only abnormal results are displayed) Labs Reviewed - No data to display  EKG None  Radiology DG Thoracic Spine 2 View  Result Date: 05/08/2020 CLINICAL DATA:  Upper back pain EXAM:  THORACIC SPINE 2 VIEWS COMPARISON:  None. FINDINGS: There is no evidence of thoracic spine fracture. No static listhesis. Mild levocurvature of the upper thoracic spine. No other significant bone abnormalities are identified. IMPRESSION: Mild levocurvature of the upper thoracic spine. No acute findings. Electronically Signed   By: Davina Poke D.O.   On: 05/08/2020 15:41    Procedures Procedures (including critical care time)  Medications Ordered in ED Medications  ketorolac (TORADOL) injection 60 mg (60 mg Intramuscular Given 05/08/20 1509)  methocarbamol (ROBAXIN) tablet 1,000 mg (1,000 mg Oral Given 05/08/20 1508)  HYDROcodone-acetaminophen (NORCO/VICODIN) 5-325 MG per tablet 1 tablet (1 tablet Oral Given 05/08/20 1508)    ED Course  I have reviewed the triage vital signs and the nursing notes.  Pertinent labs & imaging results that were available during my care of the patient were reviewed by me and considered in my medical decision making (see chart for details).  Clinical Course as of May 08 1553  Wed May 08, 2020  1447 Patient was evaluated for back pain today. Patient has no concerning symptoms or physical exam findings including no fever, no loss of control of bowel or bladder, no urinary retention, no saddle anesthesia, no leg weakness and no pain radiation into the legs. He was given medication to treat his symptoms and advised to f/u with PMD for further workup including possible PT, medication change, further imaging, etc. He was advised on all concerning symptoms above and to return to the ED if any of them arise.       [KM]    Clinical Course User Index [KM] Kristine Royal   MDM Rules/Calculators/A&P                      Based on review of vitals, medical screening exam, lab work and/or imaging, there does not appear to be an acute, emergent etiology for the patient's symptoms. Counseled pt on good return precautions and encouraged both PCP and ED follow-up as  needed.  Prior to discharge, I also discussed incidental imaging findings with patient in detail and advised appropriate, recommended follow-up in detail.  Clinical Impression: 1. Acute midline thoracic back pain   2. Back muscle spasm     Disposition: Discharge  Prior to providing a prescription for a controlled substance, I independently reviewed the patient's recent prescription history on the [facility]. The patient had no recent or regular prescriptions and was deemed appropriate for a brief, less than 3 day prescription of narcotic for acute analgesia.  This note was prepared with assistance of Systems analyst. Occasional wrong-word or sound-a-like substitutions  may have occurred due to the inherent limitations of voice recognition software.  Final Clinical Impression(s) / ED Diagnoses Final diagnoses:  Acute midline thoracic back pain    Rx / DC Orders ED Discharge Orders         Ordered    cyclobenzaprine (FLEXERIL) 10 MG tablet  3 times daily PRN     05/08/20 1554           Kristine Royal 05/08/20 1554    Fredia Sorrow, MD 05/11/20 [phone]

## 2020-05-08 NOTE — Discharge Instructions (Addendum)
You were seen today for back pain. Your xray was reassuring for no acute fracture or dislocation. You have muscle tension and spasms. We have given you a prescription fopr a muscle relaxer. Please continue to take motrin also. Follow up with your primary care office and orthopedics. Thank you for allowing me to care for you today. Please return to the emergency department if you have new or worsening symptoms. Take your medications as instructed.

## 2020-05-08 NOTE — ED Triage Notes (Signed)
C/o back pain that has been worse since Monday.  Reports history of previous injury from lifting.  Pt works for [employer].

## 2020-05-08 NOTE — ED Notes (Signed)
Pt discharged before last set of vitals could be obtained. PA gave discharge instructions.

## 2020-05-20 ENCOUNTER — Ambulatory Visit: Payer: BC Managed Care – PPO | Admitting: Registered Nurse

## 2020-05-20 ENCOUNTER — Other Ambulatory Visit: Payer: Self-pay

## 2020-05-20 ENCOUNTER — Encounter: Payer: Self-pay | Admitting: Registered Nurse

## 2020-05-20 DIAGNOSIS — J302 Other seasonal allergic rhinitis: Secondary | ICD-10-CM | POA: Diagnosis not present

## 2020-05-20 DIAGNOSIS — M62830 Muscle spasm of back: Secondary | ICD-10-CM

## 2020-05-20 MED ORDER — AZELASTINE HCL 0.1 % NA SOLN: 1.0000 | Freq: Two times a day (BID) | NASAL | 12 refills | Status: AC

## 2020-05-20 MED ORDER — OLOPATADINE HCL 0.2 % OP SOLN: OPHTHALMIC | 5 refills | Status: DC

## 2020-05-20 MED ORDER — CYCLOBENZAPRINE HCL 10 MG PO TABS: 10.0000 mg | ORAL_TABLET | Freq: Three times a day (TID) | ORAL | 0 refills | Status: AC | PRN

## 2020-05-20 NOTE — Patient Instructions (Signed)
° ° ° °  If you have lab work done today you will be contacted with your lab results within the next 2 weeks.  If you have not heard from us then please contact us. The fastest way to get your results is to register for My Chart. ° ° °IF you received an x-ray today, you will receive an invoice from [facility]. Please contact [facility] at [phone] with questions or concerns regarding your invoice.  ° °IF you received labwork today, you will receive an invoice from [facility]. Please contact [facility] at [phone] with questions or concerns regarding your invoice.  ° °Our billing staff will not be able to assist you with questions regarding bills from these companies. ° °You will be contacted with the lab results as soon as they are available. The fastest way to get your results is to activate your My Chart account. Instructions are located on the last page of this paperwork. If you have not heard from us regarding the results in 2 weeks, please contact this office. °  ° ° ° °

## 2020-05-20 NOTE — Progress Notes (Signed)
Established Patient Office Visit  Subjective:  Patient ID: William Farmer, male    DOB: 05-22-1981  Age: 39 y.o. MRN: [id number]  CC:  Chief Complaint  Patient presents with  . Establish Care  . Medication Refill    per pt Hx of Allergies-Cyclobenzaprine and Olopatadine    HPI William Farmer presents for visit to establish care with myself as pcp  Needs refills on olopatadine and cyclobenzaprine  Seasonal allergies: takes otcs as needed, uses olopatadine for ocular symptoms. No major concerns, biggest complaint is sinus pressure. 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 Initial injury was thoracic spasm 2 years ago. reaggravated twice since - most recently about 3 weeks ago. Has done PT in the past, which he felt was helpful but does not want to do this due to work ramifications - hours would be cut. Needs refill of flexeril, will give, suggest stretching and RICE.  Past Medical History:  Diagnosis Date  . Allergy   . Back pain   . testicular ca dx'd 08/2011   surg only    Past Surgical History:  Procedure Laterality Date  . testicular cancer surgery      Family History  Problem Relation Age of Onset  . Diabetes Father   . CVA Father   . Diabetes Sister     Social History   Socioeconomic History  . Marital status: Married    Spouse name: Not on file  . Number of children: 2  . Years of education: Not on file  . Highest education level: Not on file  Occupational History  . Not on file  Tobacco Use  . Smoking status: Former Smoker    Types: Cigarettes    Start date: 05/20/1996    Quit date: 05/20/2005    Years since quitting: 15.0  . Smokeless tobacco: Never Used  Substance and Sexual Activity  . Alcohol use: No    Alcohol/week: 0.0 standard drinks  . Drug use: Not Currently    Types: Marijuana  . Sexual activity: Yes  Other Topics Concern  . Not on file  Social History Narrative    . Not on file   Social Determinants of Health   Financial Resource Strain:   . Difficulty of Paying Living Expenses:   Food Insecurity:   . Worried About Charity fundraiser in the Last Year:   . Arboriculturist in the Last Year:   Transportation Needs:   . Film/video editor (Medical):   Marland Kitchen Lack of Transportation (Non-Medical):   Physical Activity:   . Days of Exercise per Week:   . Minutes of Exercise per Session:   Stress:   . Feeling of Stress :   Social Connections:   . Frequency of Communication with Friends and Family:   . Frequency of Social Gatherings with Friends and Family:   . Attends Religious Services:   . Active Member of Clubs or Organizations:   . Attends Archivist Meetings:   Marland Kitchen Marital Status:   Intimate Partner Violence:   . Fear of Current or Ex-Partner:   . Emotionally Abused:   Marland Kitchen Physically Abused:   . Sexually Abused:     Outpatient Medications Prior to Visit  Medication Sig Dispense Refill  . cetirizine (ZYRTEC ALLERGY) 10 MG tablet Take 1 tablet (10 mg total) by mouth daily. 90 tablet 3  . cyclobenzaprine (FLEXERIL) 10  MG tablet Take 1 tablet (10 mg total) by mouth 3 (three) times daily as needed for up to 21 days. 21 tablet 0  . Olopatadine HCl 0.2 % SOLN INSTILL 1 DROP IN AFFECTED EYE(S) DAILY AS NEEDED 2.5 mL 5  . fluticasone (FLONASE) 50 MCG/ACT nasal spray Place 2 sprays into both nostrils daily. (Patient not taking: Reported on 05/20/2020) 16 g 12  . traMADol (ULTRAM) 50 MG tablet TK 1 T PO Q 6 H PRF BREAKTHROUGH PAIN  0   No facility-administered medications prior to visit.    Allergies  Allergen Reactions  . Pollen Extract Shortness Of Breath  . Alprazolam     unknown  . Bee Venom Swelling  . Codeine Itching    Hives, twitching  . Shrimp [Shellfish Allergy] Other (See Comments)    Itchy throat    ROS Review of Systems  Constitutional: Negative.   HENT: Negative.   Eyes: Negative.   Respiratory: Negative.    Cardiovascular: Negative.   Gastrointestinal: Negative.   Endocrine: Negative.   Genitourinary: Negative.   Musculoskeletal: Positive for back pain. Negative for arthralgias, gait problem, joint swelling, myalgias, neck pain and neck stiffness.  Skin: Negative.   Allergic/Immunologic: Positive for environmental allergies. Negative for food allergies and immunocompromised state.  Neurological: Negative.   Hematological: Negative.   Psychiatric/Behavioral: Negative.   All other systems reviewed and are negative.     Objective:    Physical Exam  Constitutional: He is oriented to person, place, and time. He appears well-developed and well-nourished. No distress.  Cardiovascular: Normal rate and regular rhythm.  Pulmonary/Chest: Effort normal. No respiratory distress.  Neurological: He is alert and oriented to person, place, and time.  Skin: Skin is warm and dry. No rash noted. He is not diaphoretic. No erythema. No pallor.  Psychiatric: He has a normal mood and affect. His behavior is normal. Judgment and thought content normal.  Nursing note and vitals reviewed.   BP 123/75   Pulse 88   Temp 97.9 F (36.6 C) (Temporal)   Resp 16   Ht 6' (1.829 m) Comment: per pt  Wt 195 lb 3.2 oz (88.5 kg)   SpO2 98%   BMI 26.47 kg/m  Wt Readings from Last 3 Encounters:  05/20/20 195 lb 3.2 oz (88.5 kg)  05/08/20 194 lb (88 kg)  10/10/18 181 lb (82.1 kg)     Health Maintenance Due  Topic Date Due  . HIV Screening  Never done    There are no preventive care reminders to display for this patient.  Lab Results  Component Value Date   TSH 1.960 09/13/2017   Lab Results  Component Value Date   WBC 4.3 09/13/2017   HGB 14.8 09/13/2017   HCT 44.5 09/13/2017   MCV 92 09/13/2017   PLT 225 09/13/2017   Lab Results  Component Value Date   NA 141 09/13/2017   K 4.2 09/13/2017   CO2 24 09/13/2017   GLUCOSE 71 09/13/2017   BUN 10 09/13/2017   CREATININE 1.27 09/13/2017   BILITOT  0.6 09/13/2017   ALKPHOS 77 09/13/2017   AST 22 09/13/2017   ALT 25 09/13/2017   PROT 7.3 09/13/2017   ALBUMIN 4.6 09/13/2017   CALCIUM 9.3 09/13/2017   ANIONGAP 13 11/27/2014   Lab Results  Component Value Date   CHOL 173 09/13/2017   Lab Results  Component Value Date   HDL 58 09/13/2017   Lab Results  Component Value Date   LDLCALC  94 09/13/2017   Lab Results  Component Value Date   TRIG 105 09/13/2017   Lab Results  Component Value Date   CHOLHDL 3.0 09/13/2017   No results found for: HGBA1C    Assessment & Plan:   Problem List Items Addressed This Visit    None    Visit Diagnoses    Back muscle spasm       Relevant Medications   cyclobenzaprine (FLEXERIL) 10 MG tablet   Seasonal allergies       Relevant Medications   Olopatadine HCl 0.2 % SOLN   azelastine (ASTELIN) 0.1 % nasal spray      Meds ordered this encounter  Medications  . cyclobenzaprine (FLEXERIL) 10 MG tablet    Sig: Take 1 tablet (10 mg total) by mouth 3 (three) times daily as needed for up to 21 days.    Dispense:  21 tablet    Refill:  0  . Olopatadine HCl 0.2 % SOLN    Sig: INSTILL 1 DROP IN AFFECTED EYE(S) DAILY AS NEEDED    Dispense:  2.5 mL    Refill:  5  . azelastine (ASTELIN) 0.1 % nasal spray    Sig: Place 1 spray into both nostrils 2 (two) times daily. Use in each nostril as directed    Dispense:  30 mL    Refill:  12    Order Specific Question:   Supervising Provider    Answer:   Forrest Moron ON:[id number]    Follow-up: No follow-ups on file.   PLAN  Refill flexeril  Refill olopatadine  Azelastine nasal spray for nasal congestion and sinus pressure  History reviewed  Return to clinic prn   Patient encouraged to call clinic with any questions, comments, or concerns.  Maximiano Coss, NP

## 2020-08-23 ENCOUNTER — Encounter: Payer: Self-pay | Admitting: Registered Nurse

## 2020-08-23 ENCOUNTER — Ambulatory Visit: Payer: BC Managed Care – PPO | Admitting: Registered Nurse

## 2020-08-23 ENCOUNTER — Other Ambulatory Visit: Payer: Self-pay

## 2020-08-23 VITALS — BP 122/81 | HR 89 | Temp 97.7°F | Resp 18 | Ht 72.0 in | Wt 190.8 lb

## 2020-08-23 DIAGNOSIS — J302 Other seasonal allergic rhinitis: Secondary | ICD-10-CM | POA: Diagnosis not present

## 2020-08-23 DIAGNOSIS — M7662 Achilles tendinitis, left leg: Secondary | ICD-10-CM

## 2020-08-23 MED ORDER — MELOXICAM 15 MG PO TABS: 15.0000 mg | ORAL_TABLET | Freq: Every day | ORAL | 0 refills | Status: DC

## 2020-08-23 MED ORDER — OLOPATADINE HCL 0.1 % OP SOLN: 1.0000 [drp] | Freq: Two times a day (BID) | OPHTHALMIC | 12 refills | Status: DC

## 2020-08-23 NOTE — Patient Instructions (Signed)
° ° ° °  If you have lab work done today you will be contacted with your lab results within the next 2 weeks.  If you have not heard from us then please contact us. The fastest way to get your results is to register for My Chart. ° ° °IF you received an x-ray today, you will receive an invoice from [facility]. Please contact [facility] at [phone] with questions or concerns regarding your invoice.  ° °IF you received labwork today, you will receive an invoice from [facility]. Please contact [facility] at [phone] with questions or concerns regarding your invoice.  ° °Our billing staff will not be able to assist you with questions regarding bills from these companies. ° °You will be contacted with the lab results as soon as they are available. The fastest way to get your results is to activate your My Chart account. Instructions are located on the last page of this paperwork. If you have not heard from us regarding the results in 2 weeks, please contact this office. °  ° ° ° °

## 2020-08-23 NOTE — Progress Notes (Signed)
Acute Office Visit  Subjective:    Patient ID: William Farmer, male    DOB: 17-Sep-1981, 39 y.o.   MRN: [id number]  Chief Complaint  Patient presents with  . Tendonitis    patient states he has been having some achilles pain for the last two days mostly feels pain when he lift or point left foot. Per patient he has been using Flexeril.    HPI Patient is in today for achilles pain  Onset around 2-3 days ago. No instigating event to his knowledge. Has been sharp pain with dorsiflexion and plantar flexion of foot. No radiation into calf. Only on left side. Has not happened before.   No other concerns or complaints.   Past Medical History:  Diagnosis Date  . Allergy   . Back pain   . testicular ca dx'd 08/2011   surg only    Past Surgical History:  Procedure Laterality Date  . testicular cancer surgery      Family History  Problem Relation Age of Onset  . Diabetes Father   . CVA Father   . Diabetes Sister     Social History   Socioeconomic History  . Marital status: Married    Spouse name: Not on file  . Number of children: 2  . Years of education: Not on file  . Highest education level: Not on file  Occupational History  . Not on file  Tobacco Use  . Smoking status: Former Smoker    Types: Cigarettes    Start date: 05/20/1996    Quit date: 05/20/2005    Years since quitting: 15.2  . Smokeless tobacco: Never Used  Vaping Use  . Vaping Use: Never used  Substance and Sexual Activity  . Alcohol use: No    Alcohol/week: 0.0 standard drinks  . Drug use: Not Currently    Types: Marijuana  . Sexual activity: Yes  Other Topics Concern  . Not on file  Social History Narrative  . Not on file   Social Determinants of Health   Financial Resource Strain:   . Difficulty of Paying Living Expenses: Not on file  Food Insecurity:   . Worried About Charity fundraiser in the Last Year: Not on file  . Ran Out of Food in the Last Year: Not on file  Transportation  Needs:   . Lack of Transportation (Medical): Not on file  . Lack of Transportation (Non-Medical): Not on file  Physical Activity:   . Days of Exercise per Week: Not on file  . Minutes of Exercise per Session: Not on file  Stress:   . Feeling of Stress : Not on file  Social Connections:   . Frequency of Communication with Friends and Family: Not on file  . Frequency of Social Gatherings with Friends and Family: Not on file  . Attends Religious Services: Not on file  . Active Member of Clubs or Organizations: Not on file  . Attends Archivist Meetings: Not on file  . Marital Status: Not on file  Intimate Partner Violence:   . Fear of Current or Ex-Partner: Not on file  . Emotionally Abused: Not on file  . Physically Abused: Not on file  . Sexually Abused: Not on file    Outpatient Medications Prior to Visit  Medication Sig Dispense Refill  . azelastine (ASTELIN) 0.1 % nasal spray Place 1 spray into both nostrils 2 (two) times daily. Use in each nostril as directed 30 mL 12  .  cetirizine (ZYRTEC ALLERGY) 10 MG tablet Take 1 tablet (10 mg total) by mouth daily. 90 tablet 3  . cyclobenzaprine (FLEXERIL) 5 MG tablet Take 5 mg by mouth 3 (three) times daily as needed for muscle spasms.    . traMADol (ULTRAM) 50 MG tablet TK 1 T PO Q 6 H PRF BREAKTHROUGH PAIN (Patient not taking: Reported on 08/23/2020)  0  . fluticasone (FLONASE) 50 MCG/ACT nasal spray Place 2 sprays into both nostrils daily. (Patient not taking: Reported on 05/20/2020) 16 g 12  . Olopatadine HCl 0.2 % SOLN INSTILL 1 DROP IN AFFECTED EYE(S) DAILY AS NEEDED (Patient not taking: Reported on 08/23/2020) 2.5 mL 5   No facility-administered medications prior to visit.    Allergies  Allergen Reactions  . Pollen Extract Shortness Of Breath  . Alprazolam     unknown  . Bee Venom Swelling  . Codeine Itching    Hives, twitching  . Shrimp [Shellfish Allergy] Other (See Comments)    Itchy throat    Review of  Systems Pertinent positives and negatives per hpi      Objective:    Physical Exam Cardiovascular:     Pulses: Normal pulses.  Musculoskeletal:        General: Tenderness (L achilles) present. No swelling, deformity or signs of injury. Normal range of motion.     Right lower leg: No edema.     Left lower leg: No edema.     Comments: Thompson test shows no achilles rupture  Skin:    Capillary Refill: Capillary refill takes less than 2 seconds.  Neurological:     General: No focal deficit present.     Mental Status: He is oriented to person, place, and time. Mental status is at baseline.  Psychiatric:        Mood and Affect: Mood normal.        Behavior: Behavior normal.        Thought Content: Thought content normal.        Judgment: Judgment normal.     BP 122/81   Pulse 89   Temp 97.7 F (36.5 C) (Temporal)   Resp 18   Ht 6' (1.829 m)   Wt 190 lb 12.8 oz (86.5 kg)   SpO2 97%   BMI 25.88 kg/m  Wt Readings from Last 3 Encounters:  08/23/20 190 lb 12.8 oz (86.5 kg)  05/20/20 195 lb 3.2 oz (88.5 kg)  05/08/20 194 lb (88 kg)    There are no preventive care reminders to display for this patient.  There are no preventive care reminders to display for this patient.   Lab Results  Component Value Date   TSH 1.960 09/13/2017   Lab Results  Component Value Date   WBC 4.3 09/13/2017   HGB 14.8 09/13/2017   HCT 44.5 09/13/2017   MCV 92 09/13/2017   PLT 225 09/13/2017   Lab Results  Component Value Date   NA 141 09/13/2017   K 4.2 09/13/2017   CO2 24 09/13/2017   GLUCOSE 71 09/13/2017   BUN 10 09/13/2017   CREATININE 1.27 09/13/2017   BILITOT 0.6 09/13/2017   ALKPHOS 77 09/13/2017   AST 22 09/13/2017   ALT 25 09/13/2017   PROT 7.3 09/13/2017   ALBUMIN 4.6 09/13/2017   CALCIUM 9.3 09/13/2017   ANIONGAP 13 11/27/2014   Lab Results  Component Value Date   CHOL 173 09/13/2017   Lab Results  Component Value Date   HDL 58 09/13/2017  Lab Results   Component Value Date   LDLCALC 94 09/13/2017   Lab Results  Component Value Date   TRIG 105 09/13/2017   Lab Results  Component Value Date   CHOLHDL 3.0 09/13/2017   No results found for: HGBA1C     Assessment & Plan:   Problem List Items Addressed This Visit    None    Visit Diagnoses    Seasonal allergies    -  Primary   Relevant Medications   olopatadine (PATANOL) 0.1 % ophthalmic solution   Achilles tendinitis of left lower extremity       Relevant Medications   meloxicam (MOBIC) 15 MG tablet       Meds ordered this encounter  Medications  . meloxicam (MOBIC) 15 MG tablet    Sig: Take 1 tablet (15 mg total) by mouth daily.    Dispense:  30 tablet    Refill:  0    Order Specific Question:   Supervising Provider    Answer:   Carlota Raspberry, JEFFREY R [[id number]]  . olopatadine (PATANOL) 0.1 % ophthalmic solution    Sig: Place 1 drop into both eyes 2 (two) times daily.    Dispense:  5 mL    Refill:  12    Order Specific Question:   Supervising Provider    Answer:   Carlota Raspberry, JEFFREY R [[id number]]   PLAN  Continue flexeril. Add meloxicam 15mg  PO qd PRN. Continue otc tylenol use  Refill olopatadine eye drops  Return precautions reviewed  Patient encouraged to call clinic with any questions, comments, or concerns.  Maximiano Coss, NP

## 2020-10-27 ENCOUNTER — Other Ambulatory Visit: Payer: Self-pay | Admitting: Registered Nurse

## 2020-10-27 DIAGNOSIS — M7662 Achilles tendinitis, left leg: Secondary | ICD-10-CM

## 2020-10-27 NOTE — Telephone Encounter (Signed)
Requested Prescriptions  Pending Prescriptions Disp Refills  . meloxicam (MOBIC) 15 MG tablet [Pharmacy Med Name: MELOXICAM 15MG  TABLETS] 30 tablet 0    Sig: TAKE 1 TABLET(15 MG) BY MOUTH DAILY     Analgesics:  COX2 Inhibitors Failed - 10/27/2020 11:05 AM      Failed - HGB in normal range and within 360 days    Hemoglobin  Date Value Ref Range Status  09/13/2017 14.8 13.0 - 17.7 g/dL Final   HGB  Date Value Ref Range Status  02/20/2013 15.1 13.0 - 17.1 g/dL Final         Failed - Cr in normal range and within 360 days    Creatinine  Date Value Ref Range Status  02/20/2013 1.4 (H) 0.7 - 1.3 mg/dL Final   Creatinine, Ser  Date Value Ref Range Status  09/13/2017 1.27 0.76 - 1.27 mg/dL Final         Passed - Patient is not pregnant      Passed - Valid encounter within last 12 months    Recent Outpatient Visits          2 months ago Seasonal allergies   [facility], [place], NP   5 months ago Back muscle spasm   [facility], Delfino Lovett, NP   2 years ago Encounter to establish care   [facility], [place] D, PA-C   2 years ago Back muscle spasm   [facility] [place], [place], [place]   2 years ago Seasonal allergies   [facility] at [facility], Audrie Lia, PA-C

## 2021-07-01 ENCOUNTER — Other Ambulatory Visit: Payer: Self-pay | Admitting: Internal Medicine

## 2021-07-01 DIAGNOSIS — N50819 Testicular pain, unspecified: Secondary | ICD-10-CM

## 2021-07-01 DIAGNOSIS — M5414 Radiculopathy, thoracic region: Secondary | ICD-10-CM

## 2021-07-11 ENCOUNTER — Ambulatory Visit
Admission: RE | Admit: 2021-07-11 | Discharge: 2021-07-11 | Disposition: A | Discharge status: Home / Self Care | Payer: BC Managed Care – PPO | Source: Ambulatory Visit | Attending: Internal Medicine | Admitting: Internal Medicine

## 2021-07-11 DIAGNOSIS — M5414 Radiculopathy, thoracic region: Secondary | ICD-10-CM

## 2021-07-11 DIAGNOSIS — N50819 Testicular pain, unspecified: Secondary | ICD-10-CM

## 2022-01-08 ENCOUNTER — Encounter: Payer: Self-pay | Admitting: Emergency Medicine

## 2022-01-08 ENCOUNTER — Ambulatory Visit
Admission: EM | Admit: 2022-01-08 | Discharge: 2022-01-08 | Disposition: A | Discharge status: Home / Self Care | Payer: BC Managed Care – PPO | Attending: Physician Assistant | Admitting: Physician Assistant

## 2022-01-08 ENCOUNTER — Other Ambulatory Visit: Payer: Self-pay

## 2022-01-08 DIAGNOSIS — M546 Pain in thoracic spine: Secondary | ICD-10-CM | POA: Diagnosis not present

## 2022-01-08 MED ORDER — CYCLOBENZAPRINE HCL 10 MG PO TABS: 10.0000 mg | ORAL_TABLET | Freq: Two times a day (BID) | ORAL | 0 refills | Status: AC | PRN

## 2022-01-08 MED ORDER — PREDNISONE 20 MG PO TABS: 40.0000 mg | ORAL_TABLET | Freq: Every day | ORAL | 0 refills | Status: AC

## 2022-01-08 NOTE — ED Provider Notes (Signed)
[facility]    CSN: [id number] Arrival date & time: 01/08/22  1120      History   Chief Complaint Chief Complaint  Patient presents with   Back Pain    HPI William Farmer is a 41 y.o. male.   Patient here today for evaluation of thoracic back pain and muscle spasms he has been having for the last day.  He states that he has not had any recent injury to his back but did have an injury several years ago.  He has not taken any medication for symptoms.  He denies any numbness or tingling.  The history is provided by the patient.  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 Authorizing Provider  azelastine (ASTELIN) 0.1 % nasal spray Place 1 spray into both nostrils 2 (two) times daily. Use in each nostril as directed 05/20/20  Yes Maximiano Coss, NP  cetirizine (ZYRTEC ALLERGY) 10 MG tablet Take 1 tablet (10 mg total) by mouth daily. 10/10/18  Yes Timmothy Euler, [place] D, PA-C  cyclobenzaprine (FLEXERIL) 10 MG tablet Take 1 tablet (10 mg total) by mouth 2 (two) times daily as needed for muscle spasms. 01/08/22  Yes Francene Finders, PA-C  predniSONE (DELTASONE) 20 MG tablet Take 2 tablets (40 mg total) by mouth daily with breakfast for 5 days. 01/08/22 01/13/22 Yes Francene Finders, PA-C  meloxicam (MOBIC) 15 MG tablet TAKE 1 TABLET(15 MG) BY MOUTH DAILY 10/27/20   Maximiano Coss, NP  olopatadine (PATANOL) 0.1 % ophthalmic  solution Place 1 drop into both eyes 2 (two) times daily. 08/23/20   Maximiano Coss, NP  traMADol (ULTRAM) 50 MG tablet TK 1 T PO Q 6 H PRF BREAKTHROUGH PAIN Patient not taking: Reported on 08/23/2020 07/28/18   [provider]    Family History Family History  Problem Relation Age of Onset   Diabetes Father    CVA Father    Diabetes Sister     Social History Social History   Tobacco Use   Smoking status: Former    Types: Cigarettes    Start date: 05/20/1996    Quit date: 05/20/2005    Years since quitting: 16.6   Smokeless tobacco: Never  Vaping Use   Vaping Use: Never used  Substance Use Topics   Alcohol use: No    Alcohol/week: 0.0 standard drinks   Drug use: Not Currently    Types: Marijuana     Allergies   Pollen extract, Alprazolam, Bee venom, Codeine, and Shrimp [shellfish allergy]   Review of Systems Review of Systems  Constitutional:  Negative for chills and fever.  Eyes:  Negative for discharge and redness.  Respiratory:  Negative for shortness of breath.   Musculoskeletal:  Positive for back pain and myalgias.  Neurological:  Negative for numbness.    Physical Exam Triage  Vital Signs ED Triage Vitals  Enc Vitals Group     BP 01/08/22 1204 (!) 139/93     Pulse Rate 01/08/22 1204 84     Resp 01/08/22 1204 20     Temp 01/08/22 1204 97.9 F (36.6 C)     Temp Source 01/08/22 1204 Oral     SpO2 01/08/22 1204 96 %     Weight 01/08/22 1206 190 lb 11.2 oz (86.5 kg)     Height 01/08/22 1206 6' (1.829 m)     Head Circumference --      Peak Flow --      Pain Score 01/08/22 1206 10     Pain Loc --      Pain Edu? --      Excl. in [place]? --    No data found.  Updated Vital Signs BP (!) 139/93 (BP Location: Left Arm)    Pulse 84    Temp 97.9 F (36.6 C) (Oral)    Resp 20    Ht 6' (1.829 m)    Wt 190 lb 11.2 oz (86.5 kg)    SpO2 96%    BMI 25.86 kg/m      Physical Exam Vitals and nursing note reviewed.  Constitutional:      General: He is not in  acute distress.    Appearance: Normal appearance. He is not ill-appearing.  HENT:     Head: Normocephalic and atraumatic.  Eyes:     Conjunctiva/sclera: Conjunctivae normal.  Cardiovascular:     Rate and Rhythm: Normal rate.  Pulmonary:     Effort: Pulmonary effort is normal.  Musculoskeletal:     Comments: No TTP to midline spine diffusely, mild TTP noted to paraspinal thoracic musculature- patient stands during exam, does not sit apparently due to discomfort.  Neurological:     Mental Status: He is alert.  Psychiatric:        Mood and Affect: Mood normal.        Behavior: Behavior normal.        Thought Content: Thought content normal.     UC Treatments / Results  Labs (all labs ordered are listed, but only abnormal results are displayed) Labs Reviewed - No data to display  EKG   Radiology No results found.  Procedures Procedures (including critical care time)  Medications Ordered in UC Medications - No data to display  Initial Impression / Assessment and Plan / UC Course  I have reviewed the triage vital signs and the nursing notes.  Pertinent labs & imaging results that were available during my care of the patient were reviewed by me and considered in my medical decision making (see chart for details).   Prednisone burst and muscle relaxer prescribed for treatment. Discussed that muscle relaxer may cause drowsiness. Recommended follow up if no improvement with treatment or if symptoms worsen in any way.   Final Clinical Impressions(s) / UC Diagnoses   Final diagnoses:  Acute bilateral thoracic back pain   Discharge Instructions   None    ED Prescriptions     Medication Sig Dispense Auth. Provider   predniSONE (DELTASONE) 20 MG tablet Take 2 tablets (40 mg total) by mouth daily with breakfast for 5 days. 10 tablet Ewell Poe F, PA-C   cyclobenzaprine (FLEXERIL) 10 MG tablet Take 1 tablet (10 mg total) by mouth 2 (two) times daily as needed for muscle  spasms. 20 tablet Francene Finders, PA-C      PDMP not reviewed this  encounter.   Francene Finders, PA-C 01/08/22 1339

## 2022-01-08 NOTE — ED Triage Notes (Signed)
Patient c/o back spasms x 1 day, pain is in the middle part of his back.  Patient did have an injury to his back several years ago.  Denies any OTC pain meds.

## 2022-03-26 ENCOUNTER — Other Ambulatory Visit: Payer: Self-pay | Admitting: Registered Nurse

## 2022-03-26 DIAGNOSIS — J302 Other seasonal allergic rhinitis: Secondary | ICD-10-CM

## 2024-02-23 ENCOUNTER — Ambulatory Visit
Admission: EM | Admit: 2024-02-23 | Discharge: 2024-02-23 | Disposition: A | Discharge status: Home / Self Care | Payer: BC Managed Care – PPO | Attending: Family Medicine | Admitting: Family Medicine

## 2024-02-23 DIAGNOSIS — Z20828 Contact with and (suspected) exposure to other viral communicable diseases: Secondary | ICD-10-CM | POA: Diagnosis present

## 2024-02-23 DIAGNOSIS — J069 Acute upper respiratory infection, unspecified: Secondary | ICD-10-CM | POA: Diagnosis not present

## 2024-02-23 LAB — RESP PANEL BY RT-PCR (FLU A&B, COVID) ARPGX2
Influenza A by PCR: NEGATIVE
Influenza B by PCR: NEGATIVE
SARS Coronavirus 2 by RT PCR: NEGATIVE

## 2024-02-23 MED ORDER — ONDANSETRON 4 MG PO TBDP: 4.0000 mg | ORAL_TABLET | Freq: Three times a day (TID) | ORAL | 0 refills | Status: AC | PRN

## 2024-02-23 MED ORDER — OSELTAMIVIR PHOSPHATE 75 MG PO CAPS: 75.0000 mg | ORAL_CAPSULE | Freq: Two times a day (BID) | ORAL | 0 refills | Status: AC

## 2024-02-23 MED ORDER — IBUPROFEN 800 MG PO TABS
800.0000 mg | ORAL_TABLET | Freq: Once | ORAL | Status: AC
  Administered 2024-02-23: 800 mg via ORAL

## 2024-02-23 NOTE — ED Triage Notes (Signed)
 Pt c/o bodyaches,cough,congestion & HA since today. States exposed to flu.

## 2024-02-23 NOTE — Discharge Instructions (Addendum)
 You have influenza A.  Tamiflu wasprescribed.  Your symptoms will gradually improve over the next 7 to 10 days.  The cough may last about 3 weeks.   Take ibuprofen 600 mg with Tylenol 1000 mg for fever, headache or body aches.   For cough: You can also use guaifenesin and dextromethorphan for cough. You can use a humidifier for chest congestion and cough.  If you don't have a humidifier, you can sit in the bathroom with the hot shower running.      For sore throat: try warm salt water gargles, Mucinex sore throat cough drops or cepacol lozenges, throat spray, warm tea or water with lemon/honey, popsicles or ice, or OTC cold relief medicine for throat discomfort. You can also purchase chloraseptic spray at the pharmacy or dollar store.   For congestion: take a daily anti-histamine like Zyrtec, Claritin, and a oral decongestant, such as pseudoephedrine.  You can also use Flonase 1-2 sprays in each nostril daily. Afrin is also a good option, if you do not have high blood pressure.    It is important to stay hydrated: drink plenty of fluids (water, gatorade/powerade/pedialyte, juices, or teas) to keep your throat moisturized and help further relieve irritation/discomfort.    Return or go to the Emergency Department if symptoms worsen or do not improve in the next few days

## 2024-02-23 NOTE — ED Provider Notes (Signed)
 [facility]    CSN: [id number] Arrival date & time: 02/23/24  1613      History   Chief Complaint Chief Complaint  Patient presents with   Cough   Headache   Generalized Body Aches   Nasal Congestion    HPI William Farmer is a 43 y.o. male.   HPI  History obtained from the patient. Regnald presents for  headache, body aches, nasal congestion, cough,  rhinorrhea, sore throat and fatigue that started today while he was driving.  Took nothing for his symptoms but they just started a few hours ago.  Drank some broth this morning. Everyone has the flu at his house.       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 Authorizing Provider  ondansetron (ZOFRAN-ODT) 4 MG disintegrating tablet Take 1 tablet (4 mg total) by mouth every 8 (eight) hours as needed. 02/23/24  Yes Dainel Arcidiacono, Seward Meth, DO  oseltamivir (TAMIFLU) 75 MG capsule Take 1 capsule (75 mg total) by mouth every 12 (twelve) hours. 02/23/24  Yes Denesia Donelan, DO  azelastine (ASTELIN) 0.1 % nasal spray Place 1 spray into both nostrils 2 (two) times daily. Use in each nostril as directed 05/20/20   Janeece Agee, NP  cetirizine (ZYRTEC ALLERGY) 10 MG tablet Take 1 tablet (10 mg total) by mouth daily. 10/10/18   Benjiman Core D, PA-C  cyclobenzaprine (FLEXERIL) 10 MG tablet Take 1 tablet (10 mg total) by mouth 2 (two) times daily as needed for muscle spasms. 01/08/22   Tomi Bamberger, PA-C  meloxicam (MOBIC) 15 MG  tablet TAKE 1 TABLET(15 MG) BY MOUTH DAILY 10/27/20   Janeece Agee, NP  olopatadine (PATANOL) 0.1 % ophthalmic solution INSTILL 1 DROP IN BOTH EYES TWICE DAILY 03/26/22   Janeece Agee, NP    Family History Family History  Problem Relation Age of Onset   Diabetes Father    CVA Father    Diabetes Sister     Social History Social History   Tobacco Use   Smoking status: Former    Current packs/day: 0.00    Types: Cigarettes    Start date: 05/20/1996    Quit date: 05/20/2005    Years since quitting: 18.7   Smokeless tobacco: Never  Vaping Use   Vaping status: Never Used  Substance Use Topics   Alcohol use: No    Alcohol/week: 0.0 standard drinks of alcohol   Drug use: Not Currently    Types: Marijuana     Allergies   Pollen extract, Iodine, Alprazolam, Bee venom, Codeine, and Shrimp [shellfish allergy]   Review of Systems Review of Systems: negative unless otherwise stated in HPI.      Physical Exam Triage Vital Signs ED Triage Vitals [02/23/24 1641]  Encounter Vitals Group     BP 114/76     Systolic BP Percentile      Diastolic BP Percentile  Pulse Rate (!) 106     Resp 16     Temp 99.1 F (37.3 C)     Temp Source Oral     SpO2 99 %     Weight 190 lb (86.2 kg)     Height 6' (1.829 m)     Head Circumference      Peak Flow      Pain Score      Pain Loc      Pain Education      Exclude from Growth Chart    No data found.  Updated Vital Signs BP 114/76 (BP Location: Left Arm)   Pulse (!) 106   Temp 99.1 F (37.3 C) (Oral)   Resp 16   Ht 6' (1.829 m)   Wt 86.2 kg   SpO2 99%   BMI 25.77 kg/m   Visual Acuity Right Eye Distance:   Left Eye Distance:   Bilateral Distance:    Right Eye Near:   Left Eye Near:    Bilateral Near:     Physical Exam GEN:     alert, non-toxic appearing male in no distress    HENT:  mucus membranes moist, oropharyngeal without lesions or erythema, no tonsillar hypertrophy or exudates, clear nasal discharge,  bilateral TM normal EYES:   no scleral injection or discharge NECK:  normal ROM, no meningismus   RESP:  no increased work of breathing, clear to auscultation bilaterally CVS:   regular rate and rhythm Skin:   warm and dry, no rash on visible skin    UC Treatments / Results  Labs (all labs ordered are listed, but only abnormal results are displayed) Labs Reviewed  RESP PANEL BY RT-PCR (FLU A&B, COVID) [id number]    EKG   Radiology No results found.  Procedures Procedures (including critical care time)  Medications Ordered in UC Medications  ibuprofen (ADVIL) tablet 800 mg (800 mg Oral Given 02/23/24 1755)    Initial Impression / Assessment and Plan / UC Course  I have reviewed the triage vital signs and the nursing notes.  Pertinent labs & imaging results that were available during my care of the patient were reviewed by me and considered in my medical decision making (see chart for details).       Pt is a 43 y.o. male who presents for a few hours of respiratory symptoms with known influenza exposure. William Farmer is afebrile here without recent antipyretics. Satting well on room air. Motrin given for pain.    Overall pt is non-toxic appearing, well hydrated, without respiratory distress. Pulmonary exam is unremarkable.  COVID and influenza panel obtained and was negative. Pt's family with influenza  and he has hours of symptoms. Will treat with Tamiflu.  Risks and benefits discussed.  Zofran and Tamiflu sent to pharmacy.  Discussed symptomatic treatment. Typical duration of symptoms discussed. Work note provided.   Return and ED precautions given and voiced understanding. Discussed MDM, treatment plan and plan for follow-up with patient who agrees with plan.     Final Clinical Impressions(s) / UC Diagnoses   Final diagnoses:  Viral URI with cough  Exposure to influenza     Discharge Instructions      You have influenza A.  Tamiflu was prescribed.  Your symptoms will  gradually improve over the next 7 to 10 days.  The cough may last about 3 weeks.   Take ibuprofen 600 mg with Tylenol 1000 mg for fever, headache or body aches.   For cough:  You can also use guaifenesin and dextromethorphan for cough. You can use a humidifier for chest congestion and cough.  If you don't have a humidifier, you can sit in the bathroom with the hot shower running.      For sore throat: try warm salt water gargles, Mucinex sore throat cough drops or cepacol lozenges, throat spray, warm tea or water with lemon/honey, popsicles or ice, or OTC cold relief medicine for throat discomfort. You can also purchase chloraseptic spray at the pharmacy or dollar store.   For congestion: take a daily anti-histamine like Zyrtec, Claritin, and a oral decongestant, such as pseudoephedrine.  You can also use Flonase 1-2 sprays in each nostril daily. Afrin is also a good option, if you do not have high blood pressure.    It is important to stay hydrated: drink plenty of fluids (water, gatorade/powerade/pedialyte, juices, or teas) to keep your throat moisturized and help further relieve irritation/discomfort.    Return or go to the Emergency Department if symptoms worsen or do not improve in the next few days      ED Prescriptions     Medication Sig Dispense Auth. Provider   oseltamivir (TAMIFLU) 75 MG capsule Take 1 capsule (75 mg total) by mouth every 12 (twelve) hours. 10 capsule Iyan Flett, DO   ondansetron (ZOFRAN-ODT) 4 MG disintegrating tablet Take 1 tablet (4 mg total) by mouth every 8 (eight) hours as needed. 20 tablet Katha Cabal, DO      PDMP not reviewed this encounter.   Katha Cabal, DO 02/23/24 1800

## 2024-10-28 ENCOUNTER — Other Ambulatory Visit: Payer: Self-pay

## 2024-10-28 ENCOUNTER — Emergency Department (HOSPITAL_COMMUNITY)
Admission: EM | Admit: 2024-10-28 | Discharge: 2024-10-28 | Disposition: A | Discharge status: Home / Self Care | Attending: Emergency Medicine | Admitting: Emergency Medicine

## 2024-10-28 ENCOUNTER — Encounter (HOSPITAL_COMMUNITY): Payer: Self-pay

## 2024-10-28 ENCOUNTER — Emergency Department (HOSPITAL_COMMUNITY)

## 2024-10-28 DIAGNOSIS — M542 Cervicalgia: Secondary | ICD-10-CM | POA: Diagnosis present

## 2024-10-28 DIAGNOSIS — M5412 Radiculopathy, cervical region: Secondary | ICD-10-CM | POA: Diagnosis not present

## 2024-10-28 LAB — CBC WITH DIFFERENTIAL/PLATELET
Abs Immature Granulocytes: 0.03 K/uL (ref 0.00–0.07)
Basophils Absolute: 0.1 K/uL (ref 0.0–0.1)
Basophils Relative: 1 %
Eosinophils Absolute: 0.1 K/uL (ref 0.0–0.5)
Eosinophils Relative: 1 %
HCT: 45.3 % (ref 39.0–52.0)
Hemoglobin: 14.6 g/dL (ref 13.0–17.0)
Immature Granulocytes: 0 %
Lymphocytes Relative: 32 %
Lymphs Abs: 3.1 K/uL (ref 0.7–4.0)
MCH: 29.9 pg (ref 26.0–34.0)
MCHC: 32.2 g/dL (ref 30.0–36.0)
MCV: 92.8 fL (ref 80.0–100.0)
Monocytes Absolute: 0.6 K/uL (ref 0.1–1.0)
Monocytes Relative: 7 %
Neutro Abs: 5.6 K/uL (ref 1.7–7.7)
Neutrophils Relative %: 59 %
Platelets: 264 K/uL (ref 150–400)
RBC: 4.88 MIL/uL (ref 4.22–5.81)
RDW: 13.8 % (ref 11.5–15.5)
WBC: 9.4 K/uL (ref 4.0–10.5)
nRBC: 0 % (ref 0.0–0.2)

## 2024-10-28 LAB — BASIC METABOLIC PANEL WITH GFR
Anion gap: 8 (ref 5–15)
BUN: 7 mg/dL (ref 6–20)
CO2: 29 mmol/L (ref 22–32)
Calcium: 9.2 mg/dL (ref 8.9–10.3)
Chloride: 103 mmol/L (ref 98–111)
Creatinine, Ser: 1.32 mg/dL — ABNORMAL HIGH (ref 0.61–1.24)
GFR, Estimated: >60 mL/min
Glucose, Bld: 101 mg/dL — ABNORMAL HIGH (ref 70–99)
Potassium: 3.6 mmol/L (ref 3.5–5.1)
Sodium: 140 mmol/L (ref 135–145)

## 2024-10-28 LAB — I-STAT CHEM 8, ED
BUN: 6 mg/dL (ref 6–20)
Calcium, Ion: 1.2 mmol/L (ref 1.15–1.40)
Chloride: 99 mmol/L (ref 98–111)
Creatinine, Ser: 1.5 mg/dL — ABNORMAL HIGH (ref 0.61–1.24)
Glucose, Bld: 89 mg/dL (ref 70–99)
HCT: 45 % (ref 39.0–52.0)
Hemoglobin: 15.3 g/dL (ref 13.0–17.0)
Potassium: 3.4 mmol/L — ABNORMAL LOW (ref 3.5–5.1)
Sodium: 141 mmol/L (ref 135–145)
TCO2: 30 mmol/L (ref 22–32)

## 2024-10-28 MED ORDER — METHYLPREDNISOLONE 4 MG PO TBPK: ORAL_TABLET | ORAL | 0 refills | Status: AC

## 2024-10-28 MED ORDER — LIDOCAINE 5 % EX PTCH
1.0000 | MEDICATED_PATCH | CUTANEOUS | Status: DC
  Administered 2024-10-28: 1 via TRANSDERMAL
  Filled 2024-10-28 (×2): qty 1

## 2024-10-28 MED ORDER — HYDROCODONE-ACETAMINOPHEN 5-325 MG PO TABS: 1.0000 | ORAL_TABLET | ORAL | 0 refills | Status: AC | PRN

## 2024-10-28 MED ORDER — GADOBUTROL 1 MMOL/ML IV SOLN
8.3000 mL | Freq: Once | INTRAVENOUS | Status: AC | PRN
  Administered 2024-10-28: 8.3 mL via INTRAVENOUS

## 2024-10-28 MED ORDER — HYDROCODONE-ACETAMINOPHEN 5-325 MG PO TABS
1.0000 | ORAL_TABLET | Freq: Once | ORAL | Status: AC
  Administered 2024-10-28: 1 via ORAL
  Filled 2024-10-28 (×2): qty 1

## 2024-10-28 MED ORDER — METHYLPREDNISOLONE SODIUM SUCC 125 MG IJ SOLR
125.0000 mg | INTRAMUSCULAR | Status: AC
  Administered 2024-10-28: 125 mg via INTRAVENOUS
  Filled 2024-10-28: qty 2

## 2024-10-28 NOTE — ED Provider Notes (Signed)
 [place] EMERGENCY DEPARTMENT AT [facility] Provider Note   CSN: [id number] Arrival date & time: 10/28/24  1511     Patient presents with: Arm Numbness   William Farmer is a 43 y.o. male.   43 year old male history of cervical radiculopathy presents emergency department with arm pain and weakness.  This Thursday he had an epidural injection in his neck.  Was doing well until Friday when he started having tingling going down his right arm that has become more painful today.  Also has had some weakness of his right upper extremity but is unsure if this is limited due to pain or not.  Says that his right leg also occasionally has severe pain radiating down it.  No bowel or bladder incontinence.  Not on blood thinners.  Was referred in for an MRI       Prior to Admission medications   Medication Sig Start Date End Date Taking? Authorizing Provider  HYDROcodone -acetaminophen  (NORCO/VICODIN) 5-325 MG tablet Take 1 tablet by mouth every 4 (four) hours as needed. 10/28/24  Yes Yolande Lamar BROCKS, MD  methylPREDNISolone  (MEDROL  DOSEPAK) 4 MG TBPK tablet Take as directed on packaging 10/28/24  Yes Yolande Lamar BROCKS, MD  azelastine  (ASTELIN ) 0.1 % nasal spray Place 1 spray into both nostrils 2 (two) times daily. Use in each nostril as directed 05/20/20   Kip Ade, NP  cetirizine  (ZYRTEC  ALLERGY) 10 MG tablet Take 1 tablet (10 mg total) by mouth daily. 10/10/18   Wiseman, Brittany D, PA-C  cyclobenzaprine  (FLEXERIL ) 10 MG tablet Take 1 tablet (10 mg total) by mouth 2 (two) times daily as needed for muscle spasms. 01/08/22   Billy Asberry FALCON, PA-C  meloxicam  (MOBIC ) 15 MG tablet TAKE 1 TABLET(15 MG) BY MOUTH DAILY 10/27/20   Kip Ade, NP  olopatadine  (PATANOL) 0.1 % ophthalmic solution INSTILL 1 DROP IN BOTH EYES TWICE DAILY 03/26/22   Kip Ade, NP  ondansetron  (ZOFRAN -ODT) 4 MG disintegrating tablet Take 1 tablet (4 mg total) by mouth every 8 (eight) hours as needed.  02/23/24   Brimage, Vondra, DO  oseltamivir  (TAMIFLU ) 75 MG capsule Take 1 capsule (75 mg total) by mouth every 12 (twelve) hours. 02/23/24   Brimage, Vondra, DO    Allergies: Pollen extract, Iodine, Alprazolam, Bee venom, Codeine, and Shrimp [shellfish allergy]    Review of Systems  Updated Vital Signs BP (!) 143/97   Pulse 84   Temp 97.6 F (36.4 C) (Oral)   Resp 16   Ht 6' (1.829 m)   Wt 83.9 kg   SpO2 95%   BMI 25.09 kg/m   Physical Exam Vitals and nursing note reviewed.  Constitutional:      General: He is not in acute distress.    Appearance: He is well-developed.  HENT:     Head: Normocephalic and atraumatic.     Right Ear: External ear normal.     Left Ear: External ear normal.     Nose: Nose normal.  Eyes:     Extraocular Movements: Extraocular movements intact.     Conjunctiva/sclera: Conjunctivae normal.     Pupils: Pupils are equal, round, and reactive to light.  Neck:     Comments: Tenderness palpation at approximately C7 and C8.  No no step-offs.  Also has an area at approximately T2 that he thinks that the injection may have been impaired Cardiovascular:     Comments: Radial and DP pulses 2+ bilaterally Musculoskeletal:     Cervical back: Normal range  of motion and neck supple.     Right lower leg: No edema.     Left lower leg: No edema.     Comments: Motor: Muscle bulk and tone are normal. Strength is 5/5 in shoulder abduction, elbow flexion and extension, grip strength on LUE. Strength is 5/5 in shoulder abduction, elbow flexion and extension, grip strength in RUE. Strength is 5/5 in hip flexion, knee flexion and extension, ankle dorsiflexion and plantar flexion bilaterally. Full strength of great toe dorsiflexion bilaterally.  Sensory: Intact sensation to light touch in C5-T1 dermatomes and L2 though S1 dermatomes bilaterally.   Biceps, brachioradialis, patellar, and Achilles reflexes 2+ bilaterally.  No ankle clonus  Skin:    General: Skin is warm and  dry.  Neurological:     Mental Status: He is alert. Mental status is at baseline.  Psychiatric:        Mood and Affect: Mood normal.        Behavior: Behavior normal.     (all labs ordered are listed, but only abnormal results are displayed) Labs Reviewed  BASIC METABOLIC PANEL WITH GFR - Abnormal; Notable for the following components:      Result Value   Glucose, Bld 101 (*)    Creatinine, Ser 1.32 (*)    All other components within normal limits  I-STAT CHEM 8, ED - Abnormal; Notable for the following components:   Potassium 3.4 (*)    Creatinine, Ser 1.50 (*)    All other components within normal limits  CBC WITH DIFFERENTIAL/PLATELET    EKG: None  Radiology: MR Cervical Spine W and Wo Contrast Result Date: 10/28/2024 EXAM: MRI CERVICAL AND THORACIC SPINE WITH AND WITHOUT CONTRAST 10/28/2024 05:16:00 PM TECHNIQUE: Multiplanar multisequence MRI of the cervical and thoracic spine was performed without and with the administration of intravenous contrast. 8.3 mL gadobutrol (GADAVIST) 1 MMOL/ML injection. COMPARISON: Thoracic spine MRI 07/11/2021. CLINICAL HISTORY: FINDINGS: BONES AND ALIGNMENT: Straightening of normal cervical lordosis. Normal vertebral body heights. Marrow signal is unremarkable. No abnormal enhancement. SPINAL CORD: Normal spinal cord size. Normal spinal cord signal. SOFT TISSUES: Unremarkable. CERVICAL DISC LEVELS: C2-C3: Small disc bulge. No central spinal canal or neural foraminal stenosis. C3-C4: Small disc bulge. No central spinal canal or neural foraminal stenosis. C4-C5: Small disc bulge with bilateral uncovertebral hypertrophy. Moderate left foraminal stenosis. No spinal canal stenosis. C5-C6: Small right subarticular disc protrusion. No central spinal canal or neural foraminal stenosis. C6-C7: Small central disc protrusion. No central spinal canal or neural foraminal stenosis. C7-T1: No significant disc herniation. No spinal canal stenosis or neural foraminal  narrowing. THORACIC DISC LEVELS: No significant disc herniation. No spinal canal stenosis or neural foraminal narrowing. IMPRESSION: 1. C4-5 small disc bulge with bilateral uncovertebral hypertrophy causing moderate left foraminal stenosis. No spinal canal stenosis. 2. Straightening of normal cervical lordosis. Electronically signed by: Franky Stanford MD 10/28/2024 06:03 PM EDT RP Workstation: [id number]   MR THORACIC SPINE W WO CONTRAST Result Date: 10/28/2024 EXAM: MRI CERVICAL AND THORACIC SPINE WITH AND WITHOUT CONTRAST 10/28/2024 05:16:00 PM TECHNIQUE: Multiplanar multisequence MRI of the cervical and thoracic spine was performed without and with the administration of intravenous contrast. 8.3 mL gadobutrol (GADAVIST) 1 MMOL/ML injection. COMPARISON: Thoracic spine MRI 07/11/2021. CLINICAL HISTORY: FINDINGS: BONES AND ALIGNMENT: Straightening of normal cervical lordosis. Normal vertebral body heights. Marrow signal is unremarkable. No abnormal enhancement. SPINAL CORD: Normal spinal cord size. Normal spinal cord signal. SOFT TISSUES: Unremarkable. CERVICAL DISC LEVELS: C2-C3: Small disc bulge. No central spinal canal  or neural foraminal stenosis. C3-C4: Small disc bulge. No central spinal canal or neural foraminal stenosis. C4-C5: Small disc bulge with bilateral uncovertebral hypertrophy. Moderate left foraminal stenosis. No spinal canal stenosis. C5-C6: Small right subarticular disc protrusion. No central spinal canal or neural foraminal stenosis. C6-C7: Small central disc protrusion. No central spinal canal or neural foraminal stenosis. C7-T1: No significant disc herniation. No spinal canal stenosis or neural foraminal narrowing. THORACIC DISC LEVELS: No significant disc herniation. No spinal canal stenosis or neural foraminal narrowing. IMPRESSION: 1. C4-5 small disc bulge with bilateral uncovertebral hypertrophy causing moderate left foraminal stenosis. No spinal canal stenosis. 2. Straightening of normal  cervical lordosis. Electronically signed by: Franky Stanford MD 10/28/2024 06:03 PM EDT RP Workstation: [id number]     Procedures   Medications Ordered in the ED  lidocaine (LIDODERM) 5 % 1 patch (1 patch Transdermal Patch Applied 10/28/24 1736)  HYDROcodone -acetaminophen  (NORCO/VICODIN) 5-325 MG per tablet 1 tablet (1 tablet Oral Given 10/28/24 1727)  methylPREDNISolone  sodium succinate (SOLU-MEDROL ) 125 mg/2 mL injection 125 mg (125 mg Intravenous Given 10/28/24 1727)  gadobutrol (GADAVIST) 1 MMOL/ML injection 8.3 mL (8.3 mLs Intravenous Contrast Given 10/28/24 1650)    Clinical Course as of 10/28/24 1924  Sat Oct 28, 2024  1851 Reassessed.  Pain and weakness have resolved after the p.o. medication [RP]    Clinical Course User Index [RP] Yolande Lamar BROCKS, MD                                 Medical Decision Making Amount and/or Complexity of Data Reviewed Labs: ordered. Radiology: ordered.  Risk Prescription drug management.   William Farmer is a 43 year old male history of cervical radiculopathy presents emergency department with arm pain and weakness.  Initial Ddx:  Cervical radiculopathy, epidural hematoma, epidural abscess, myelopathy  MDM/Course:  Patient presents emergency department with right upper extremity pain and tingling after having a spinal injection done several days ago.  On exam does have weakness in that arm but is somewhat limited due to pain.  Was referred in for an MRI today.  Did obtain an MRI of the cervical and thoracic spine with and without contrast due to location of his pain and the injection which did not show acute abnormality.  Patient was given Solu-Medrol , Norco, and lidocaine patch and upon re-evaluation pain is resolved.  His neuroexam is now normal with strength in his right upper and lower extremity.  Suspect that he likely has cervical radiculopathy.  Will have him follow-up with his neurosurgeon as an outpatient.  Given a Medrol  Dosepak and  short course of Norco to take at home  This patient presents to the ED for concern of complaints listed in HPI, this involves an extensive number of treatment options, and is a complaint that carries with it a high risk of complications and morbidity. Disposition including potential need for admission considered.   Dispo: DC Home. Return precautions discussed including, but not limited to, those listed in the AVS. Allowed pt time to ask questions which were answered fully prior to dc.  Additional history obtained from significant other Records reviewed Outpatient Clinic Notes The following labs were independently interpreted: Chemistry and show no acute abnormality I have reviewed the patients home medications and made adjustments as needed  Portions of this note were generated with Dragon dictation software. Dictation errors may occur despite best attempts at proofreading.     Final diagnoses:  Cervical radiculopathy    ED Discharge Orders          Ordered    methylPREDNISolone  (MEDROL  DOSEPAK) 4 MG TBPK tablet        10/28/24 1852    HYDROcodone -acetaminophen  (NORCO/VICODIN) 5-325 MG tablet  Every 4 hours PRN        10/28/24 1852               Yolande Lamar BROCKS, MD 10/28/24 1924

## 2024-10-28 NOTE — Discharge Instructions (Signed)
 You were seen for your neck pain in the emergency department.   At home, please use over-the-counter Tylenol , ibuprofen , and lidocaine patches.  You may also take the norco we have prescribed you for any breakthrough pain that may have.  Please note that it contains tylenol  so do not exceed the daily recommended dosage of tylenol . Do not take this before driving or operating heavy machinery.  Do not take this medication with alcohol.  Take the Medrol  Dosepak you have given you  Follow-up with your primary doctor in 2-3 days regarding your visit.  Follow-up with the spine clinic as soon as possible regarding your symptoms.   Return immediately to the emergency department if you experience any of the following: Numbness or weakness of your arms or legs, bowel or bladder incontinence, numbness while wiping after pooping or urinating, or any other concerning symptoms.    Thank you for visiting our Emergency Department. It was a pleasure taking care of you today.

## 2024-10-28 NOTE — ED Triage Notes (Addendum)
 Patient got an injection in his neck Thursday to help with bulging disc pain. 2nd time getting this shot. Today his whole right arm hurts. Feels like a bee stung around his whole arm. Has muscle spasms in his whole arm. His doctor sent him for MRI.

## 2024-10-28 NOTE — ED Notes (Signed)
 Pt. I-stat Chem 8 results potassium 3.4, EDP,Patterson,MD. Made aware.
